# Patient Record
Sex: Male | Born: 1935 | Race: White | Smoking: Former smoker
Health system: Southern US, Community
[De-identification: ages and names within clinical notes are randomized; demographics above are authoritative.]

## PROBLEM LIST (undated history)

## (undated) DIAGNOSIS — I509 Heart failure, unspecified: Secondary | ICD-10-CM

## (undated) DIAGNOSIS — I4891 Unspecified atrial fibrillation: Secondary | ICD-10-CM

## (undated) DIAGNOSIS — H9110 Presbycusis, unspecified ear: Secondary | ICD-10-CM

## (undated) DIAGNOSIS — F99 Mental disorder, not otherwise specified: Secondary | ICD-10-CM

## (undated) DIAGNOSIS — Z8611 Personal history of tuberculosis: Secondary | ICD-10-CM

## (undated) DIAGNOSIS — J449 Chronic obstructive pulmonary disease, unspecified: Secondary | ICD-10-CM

## (undated) HISTORY — DX: Chronic obstructive pulmonary disease, unspecified: J44.9

## (undated) HISTORY — DX: Heart failure, unspecified: I50.9

## (undated) HISTORY — DX: Unspecified atrial fibrillation: I48.91

## (undated) HISTORY — DX: Presbycusis, unspecified ear: H91.10

## (undated) HISTORY — DX: Personal history of tuberculosis: Z86.11

## (undated) HISTORY — DX: Mental disorder, not otherwise specified: F99

---

## 2012-06-06 HISTORY — PX: TRANSURETHRAL RESECTION OF PROSTATE: SHX73

## 2012-09-06 DIAGNOSIS — F99 Mental disorder, not otherwise specified: Secondary | ICD-10-CM

## 2012-09-06 HISTORY — PX: CT PERC CHOLECYSTOSTOMY: HXRAD817

## 2012-09-06 HISTORY — DX: Mental disorder, not otherwise specified: F99

## 2013-04-21 DIAGNOSIS — I509 Heart failure, unspecified: Secondary | ICD-10-CM | POA: Insufficient documentation

## 2013-04-21 DIAGNOSIS — R609 Edema, unspecified: Secondary | ICD-10-CM

## 2013-04-21 DIAGNOSIS — J4489 Other specified chronic obstructive pulmonary disease: Secondary | ICD-10-CM | POA: Insufficient documentation

## 2013-04-21 DIAGNOSIS — I4891 Unspecified atrial fibrillation: Secondary | ICD-10-CM

## 2013-04-21 DIAGNOSIS — Z79899 Other long term (current) drug therapy: Secondary | ICD-10-CM

## 2013-04-21 DIAGNOSIS — R413 Other amnesia: Secondary | ICD-10-CM

## 2013-04-21 DIAGNOSIS — J449 Chronic obstructive pulmonary disease, unspecified: Secondary | ICD-10-CM

## 2013-05-06 ENCOUNTER — Ambulatory Visit (INDEPENDENT_AMBULATORY_CARE_PROVIDER_SITE_OTHER): Payer: Medicare HMO | Admitting: Cardiology

## 2013-05-06 ENCOUNTER — Encounter: Payer: Self-pay | Admitting: Cardiology

## 2013-05-06 VITALS — BP 138/60 | HR 60 | Ht 71.0 in | Wt 194.0 lb

## 2013-05-06 DIAGNOSIS — I4892 Unspecified atrial flutter: Secondary | ICD-10-CM

## 2013-05-06 NOTE — Progress Notes (Signed)
Patient ID: John Cherry, male   DOB: 1918-01-02, 79 y.o.   MRN: [id number]      [address] [place], [place]  [address] Phone: [phone] Fax:  [phone]  Date:  05/06/2013   ID:  Coty Larsh, DOB 1918-06-01, MRN [id number]  PCP:  Ginette Otto, MD   History of Present Illness: Natan Hartog is a 79 y.o. male with chronic atrial fibrillation/flutter on chronic anticoagulation with history of diastolic heart failure while in [place] here for evaluation at the request of Dr. Pete Glatter. Nubulizer helps with mucus, phlegm. No chest pain. No significant dyspnea on exertion.  He's been having some lower extremity edema which appears to be chronic and a degree of venous insufficiency as well. Thankfully, BNP was 98. Hemoglobin 13.3. INR 2.6 managed by Dr. Pete Glatter. Creatinine is 1.3 to with potassium of 4.1. He has been on twice a day Lasix 40 mg.  11-06-12wife died. At the end of Jan 10, 2012 went into hospital. Diastolic HF episode, ?PNA. TURP 11/13. Rehospitalization because of cholecystitis. Daughter is Museum/gallery exhibitions officer. Hypotension with anesthesia. Never took gall bladder out because of hypotension. Wanted to move here. 04/09/13. [facility].   [place]. Enjoys PT.      Wt Readings from Last 3 Encounters:  05/06/13 87.998 kg (194 lb)     Past Medical History  Diagnosis Date  . CHF (congestive heart failure)   . COPD (chronic obstructive pulmonary disease)   . Atrial fibrillation   . Presbycusis   . H/O TB (tuberculosis)     as a teenager  . Abnormal mini-mental status exam February 2014    24/30    Past Surgical History  Procedure Laterality Date  . Transurethral resection of prostate  November 2013  . Ct perc cholecystostomy  February 2014    Current Outpatient Prescriptions  Medication Sig Dispense Refill  . acetaminophen (TYLENOL) 500 MG tablet Take 500 mg by mouth every 6 (six)  hours as needed for pain.      . bisacodyl (BISACODYL) 5 MG EC tablet Take 10 mg by mouth daily as needed for constipation.      . digoxin (LANOXIN) 0.125 MG tablet Take 0.125 mg by mouth daily.      Marland Kitchen diltiazem (DILACOR XR) 120 MG 24 hr capsule Take 120 mg by mouth daily.      . furosemide (LASIX) 40 MG tablet Take 40 mg by mouth 2 (two) times daily.       Marland Kitchen gabapentin (NEURONTIN) 800 MG tablet Take 800 mg by mouth 2 (two) times daily.      Marland Kitchen ipratropium-albuterol (DUONEB) 0.5-2.5 (3) MG/3ML SOLN Take 3 mLs by nebulization every 6 (six) hours as needed.      . Loperamide-Simethicone (IMODIUM ADVANCED) 2-125 MG CHEW Chew by mouth.      . Multiple Vitamin (MULTIVITAMIN) tablet Take 1 tablet by mouth daily.      . NON FORMULARY 2 liters of O2 daily      . pantoprazole (PROTONIX) 40 MG tablet Take 40 mg by mouth daily.      . potassium chloride (K-DUR,KLOR-CON) 10 MEQ tablet Take 10 mEq by mouth 3 (three) times daily.      . Probiotic Product (PROBIOTIC DAILY PO) Take by mouth.      . tamsulosin (FLOMAX) 0.4 MG CAPS capsule Take by mouth daily after supper.      . warfarin (COUMADIN)  5 MG tablet Take 5 mg by mouth daily. 5mg  daily except on Mon,Wed,Fri,Sat take 7.5 mg       No current facility-administered medications for this visit.    Allergies:    Allergies  Allergen Reactions  . Sulfa Antibiotics     Social History:  The patient  reports that he quit smoking about 49 years ago. He does not have any smokeless tobacco history on file. He reports that  drinks alcohol.   Family history is noncontributory.  ROS:  Please see the history of present illness.   Denies any fevers, chills, orthopnea, PND, syncope, rashes. He does have some short-term memory loss but overall judgment is quite good. COPD has been present. Chronic bronchitis.    All other systems reviewed and negative.   PHYSICAL EXAM: VS:  BP 138/60  Pulse 60  Ht 5\' 11"  (1.803 m)  Wt 87.998 kg (194 lb)  BMI 27.07 kg/m2  SpO2  97% Well nourished, well developed, in no acute distress, elderly male, uses walker HEENT: normalNCAT, EOMI Neck: no JVDNo obvious carotid bruits, normal upstroke Cardiac:  normal S1, S2; RRR; soft systolic murmur right upper sternal border Lungs: + wheezing bilaterally, rhonchi or ralesNormal respiratory effort Abd: soft, nontender, no hepatomegaly Ext: Chronic appearing lower extremity 2+ edema, compression stockings knee high Skin: warm and dry GU: Deferred Neuro: no focal abnormalities noted  EKG:   Atrial flutter heart rate of 60, 4-1 conduction, left axis deviation, poor R wave progression.   ASSESSMENT AND PLAN:  79 year old with chronic atrial fibrillation/flutter, chronic anticoagulation, chronic venous insufficiency/lower extremity edema, chronic kidney disease stage III.  1. Atrial flutter/fibrillation-continue with current therapy including digoxin/low-dose diltiazem. He is very well rate controlled. No hilar symptoms such as syncope.  2. Chronic anticoagulation-managed with Dr. Pete Glatter. 3. Chronic diastolic heart failure-actually, BNP is 92, normal. I will continue with Lasix 40 mg twice a day. I explained to them that overdiuresis may result in more difficulties such as dizziness, hypotension, worsening renal function. I heard wheezes today on exam, likely from chronic lung disease. 4. COPD-end expiratory wheezes heard throughout lung fields. He is actually doing quite well, ambulating well. Continue with nebulizer. Dr. Shelle Iron will be seeing him. 5. Chronic venous insufficiency/edema-we will give him a prescription for compression hose.  Prior medical records reviewed, we will see him back in 6 months.  Signed, Donato Schultz, MD [facility]  05/06/2013 3:58 PM

## 2013-05-06 NOTE — Patient Instructions (Addendum)
Your physician wants you to follow-up in: 6 months with Dr. Anne Fu. You will receive a reminder letter in the mail two months in advance. If you don't receive a letter, please call our office to schedule the follow-up appointment.  Pt given written rx for knee high compression stockings. (20-56mmhg closed toe)  Your physician recommends that you continue on your current medications as directed. Please refer to the Current Medication list given to you today.

## 2013-05-20 ENCOUNTER — Ambulatory Visit (INDEPENDENT_AMBULATORY_CARE_PROVIDER_SITE_OTHER): Payer: Medicare HMO | Admitting: Pulmonary Disease

## 2013-05-20 ENCOUNTER — Encounter: Payer: Self-pay | Admitting: Pulmonary Disease

## 2013-05-20 VITALS — BP 104/52 | HR 62 | Temp 98.2°F | Ht 70.0 in | Wt 192.0 lb

## 2013-05-20 DIAGNOSIS — J449 Chronic obstructive pulmonary disease, unspecified: Secondary | ICD-10-CM

## 2013-05-20 NOTE — Patient Instructions (Signed)
You do not have significant copd by your breathing studies.  Therefore would not put you on everyday medication at this time. Use breathing treatments as needed.  However, I want you to sit down and give yourself about 5 min to rest if you get short of breath before reaching for your nebulizer treatment.  If your breathing does not settle down, ok to proceed with the breathing treatment. Try zyrtec 10mg  one each night at bedtime for a few weeks to see if it helps with your drainage and the noise coming from your upper airway. Work on a conditioning program followup with me as needed.

## 2013-05-20 NOTE — Progress Notes (Signed)
  Subjective:    Patient ID: John Cherry, male    DOB: [birth year], 79 y.o.   MRN: [id number]  HPI The patient is a 79 year old male who been asked to see for COPD.  The patient recently moved here from [place], and is on as needed nebulized bronchodilators for his breathing.  He has a history of smoking, but quit many years ago.  He tells me that he has never had pulmonary function studies before, but has been given the diagnosis of "COPD".  He also has a history of chronic atrial fibrillation for which he is on anticoagulation, as well as diastolic dysfunction with lower extremity edema.  The patient tells me that he has fairly good exertional tolerance, and does quite a bit of walking on a regular basis.  He is not able to go up and down stairs more because of physical disability.  He does get short of breath at times, and will take one of his nebulizer treatments, and thinks that it does help.  He will typically use the nebulizer one to 4 times a day.  He states that he usually does not sit down to get his breath before reaching for a nebulizer treatment.  He has also been noted to have audible wheezing, but his daughter states that he will often cough and clear his throat in the wheezing will go away.  This is classic for upper airway pseudo-wheezing.  He has minimal cough, but at times will bring a non-purulent mucus intermittently.  His daughter thinks a lot of this is coming from his nasal passages.  The patient has a history of tuberculosis as a child, and has been told that his x-ray is abnormal.  The daughter tells me that an x-ray just prior to leaving [place] showed no acute process.   Review of Systems  Constitutional: Negative for fever and unexpected weight change.  HENT: Negative for congestion, dental problem, ear pain, nosebleeds, postnasal drip, rhinorrhea, sinus pressure, sneezing, sore throat and trouble swallowing.   Eyes: Negative for redness and itching.  Respiratory: Positive  for wheezing. Negative for cough, chest tightness and shortness of breath.   Cardiovascular: Positive for leg swelling. Negative for palpitations.  Gastrointestinal: Negative for nausea and vomiting.  Genitourinary: Negative for dysuria.  Musculoskeletal: Negative for joint swelling.  Skin: Negative for rash.  Neurological: Negative for headaches.  Hematological: Does not bruise/bleed easily.  Psychiatric/Behavioral: Negative for dysphoric mood. The patient is not nervous/anxious.        Objective:   Physical Exam Constitutional:  Well developed, no acute distress  HENT:  Nares patent without discharge, septal deviation to the left with narrowing.  Oropharynx without exudate, palate and uvula are normal  Eyes:  Perrla, eomi, no scleral icterus  Neck:  No JVD, no TMG  Cardiovascular:  Normal rate, regular rhythm, no rubs or gallops.  2/6 sem        Intact distal pulses but decreased.  Pulmonary :  Normal breath sounds, no stridor or respiratory distress   No rhonchi, or wheezing.  Mild basilar crackles, prominent upper airway pseudowheezing.   Abdominal:  Soft, nondistended, bowel sounds present.  No tenderness noted.   Musculoskeletal:  1+ lower extremity edema noted.  Lymph Nodes:  No cervical lymphadenopathy noted  Skin:  No cyanosis noted  Neurologic:  Alert, appropriate, moves all 4 extremities without obvious deficit.         Assessment & Plan:

## 2013-05-20 NOTE — Assessment & Plan Note (Signed)
The patient has very little airflow obstruction by his spirometry today, and therefore I do not think we should put him on a maintenance medication regimen at this time.  I think it is okay for him to use his nebulized bronchodilators as needed, but I have asked him not to use these for "wheezing".  His wheezing is clearly coming from his upper airway, and probably related to postnasal drip, or upper airway instability.  He does have symptoms of postnasal drip and mucus at times.  I have also asked him to sit down first and see if his breathing issues resolve if he gets short of breath before reaching for his nebulizer machine.  I really think strengthening and conditioning will help him from a quality of life standpoint more than anything else.  I will see him back on a p.r.n. Basis, but would be happy to see him again if his symptoms worsen.

## 2013-06-15 ENCOUNTER — Encounter: Payer: Self-pay | Admitting: Pulmonary Disease

## 2013-06-15 DIAGNOSIS — J90 Pleural effusion, not elsewhere classified: Secondary | ICD-10-CM | POA: Insufficient documentation

## 2013-09-14 ENCOUNTER — Encounter (INDEPENDENT_AMBULATORY_CARE_PROVIDER_SITE_OTHER): Payer: Self-pay

## 2013-09-14 ENCOUNTER — Ambulatory Visit (INDEPENDENT_AMBULATORY_CARE_PROVIDER_SITE_OTHER): Payer: Medicare HMO | Admitting: Cardiology

## 2013-09-14 ENCOUNTER — Encounter: Payer: Self-pay | Admitting: Cardiology

## 2013-09-14 VITALS — BP 118/64 | HR 43 | Ht 70.0 in | Wt 191.0 lb

## 2013-09-14 DIAGNOSIS — J4489 Other specified chronic obstructive pulmonary disease: Secondary | ICD-10-CM

## 2013-09-14 DIAGNOSIS — I4891 Unspecified atrial fibrillation: Secondary | ICD-10-CM

## 2013-09-14 DIAGNOSIS — Z79899 Other long term (current) drug therapy: Secondary | ICD-10-CM

## 2013-09-14 DIAGNOSIS — I4892 Unspecified atrial flutter: Secondary | ICD-10-CM

## 2013-09-14 DIAGNOSIS — J449 Chronic obstructive pulmonary disease, unspecified: Secondary | ICD-10-CM

## 2013-09-14 NOTE — Patient Instructions (Signed)
Your physician has recommended you make the following change in your medication:   1. Decrease Lasix to 40 mg once daily.  Your physician wants you to follow-up in: 6 months with Dr. Dawna Part will receive a reminder letter in the mail two months in advance. If you don't receive a letter, please call our office to schedule the follow-up appointment.

## 2013-09-14 NOTE — Progress Notes (Signed)
Patient ID: John Cherry, male   DOB: 1917/11/09, 80 y.o.   MRN: [id number]      3  [address], [address], [place]  [address] Phone: [phone] Fax:  [phone]  Date:  09/14/2013   ID:  Tremell Reimers, DOB 03-29-1918, MRN [id number]  PCP:  Mathews Argyle, MD   History of Present Illness: Kentarius Partington is a 80 y.o. male with chronic atrial fibrillation/flutter on chronic anticoagulation with history of diastolic heart failure while in [place] here for evaluation at the request of Dr. Felipa Eth. Nubulizer helps with mucus, phlegm. No chest pain. No significant dyspnea on exertion.  He's been having some lower extremity edema which appears to be chronic and a degree of venous insufficiency as well. Thankfully, BNP was 98. Hemoglobin 13.3. INR 2.6 managed by Dr. Felipa Eth. Creatinine is 1.3 to with potassium of 4.1. He has been on twice a day Lasix 40 mg.  06-06-11 wife died. At the end of 12-05-2011 went into hospital. Diastolic HF episode, ?PNA. TURP 11/13. Rehospitalization because of cholecystitis. Daughter is Firefighter. Hypotension with anesthesia. Never took gall bladder out because of hypotension. Wanted to move here. 04/09/13. [facility].   [place]. Enjoys PT.   Cut back on medications. Dr. Gwenette Greet. Very little COPD. Off nebs. Off neurontin. Off digoxin. Currently doing quite well. Interested in going to [place] on a cruise.     Wt Readings from Last 3 Encounters:  09/14/13 191 lb (86.637 kg)  05/20/13 192 lb (87.091 kg)  05/06/13 194 lb (87.998 kg)     Past Medical History  Diagnosis Date  . CHF (congestive heart failure)   . COPD (chronic obstructive pulmonary disease)   . Atrial fibrillation   . Presbycusis   . H/O TB (tuberculosis)     as a teenager  . Abnormal mini-mental status exam February 2014    24/30    Past Surgical History  Procedure Laterality Date  .  Transurethral resection of prostate  November 2013  . Ct perc cholecystostomy  February 2014    Current Outpatient Prescriptions  Medication Sig Dispense Refill  . acetaminophen (TYLENOL) 500 MG tablet Take 500 mg by mouth every 6 (six) hours as needed for pain.      Marland Kitchen diltiazem (DILACOR XR) 120 MG 24 hr capsule Take 120 mg by mouth daily.      . furosemide (LASIX) 40 MG tablet Take 40 mg by mouth 2 (two) times daily.       . potassium chloride (K-DUR,KLOR-CON) 10 MEQ tablet Take 10 mEq by mouth 2 (two) times daily.       Marland Kitchen warfarin (COUMADIN) 5 MG tablet Take 6 mg by mouth daily.        No current facility-administered medications for this visit.    Allergies:    Allergies  Allergen Reactions  . Sulfa Antibiotics     Social History:  The patient  reports that he quit smoking about 49 years ago. His smoking use included Cigarettes. He has a 25 pack-year smoking history. He does not have any smokeless tobacco history on file. He reports that he drinks alcohol. He reports that he does not use illicit drugs.   Family history is noncontributory.  ROS:  Please see the history of present illness.   Denies any fevers, chills, orthopnea, PND, syncope, rashes. He does have some short-term memory loss but overall judgment is quite  good. COPD has been present. Chronic bronchitis.    All other systems reviewed and negative.   PHYSICAL EXAM: VS:  BP 118/64  Pulse 43  Ht 5\' 10"  (1.778 m)  Wt 191 lb (86.637 kg)  BMI 27.41 kg/m2  SpO2 94% Well nourished, well developed, in no acute distress, elderly male, uses walker HEENT: normalNCAT, EOMI Neck: no JVDNo obvious carotid bruits, normal upstroke Cardiac:  Mildly irregular, at times tachycardic.; soft systolic murmur right upper sternal border Lungs: + wheezing bilaterally, rhonchi or ralesNormal respiratory effort Abd: soft, nontender, no hepatomegaly Ext: Chronic appearing lower extremity 2+ edema, compression stockings knee high Skin: warm  and dry GU: Deferred Neuro: no focal abnormalities noted  EKG:   Atrial flutter heart rate of 60, 4-1 conduction, left axis deviation, poor R wave progression.   ASSESSMENT AND PLAN:  80 year old with chronic atrial fibrillation/flutter, chronic anticoagulation, chronic venous insufficiency/lower extremity edema, chronic kidney disease stage III.  1. Atrial flutter/fibrillation-was on low dose digoxin which was stopped by Dr. Felipa Eth after review with daughter. Low-dose diltiazem. He is very well rate controlled. No hilar symptoms such as syncope. Bradycardia first noted however when I auscultated him, at times he was tachycardic. 2. Chronic anticoagulation-managed with Dr. Felipa Eth. Explained in aspirin alone would not be sufficient in stroke prevention. No bleeding. 3. Chronic diastolic heart failure-actually, BNP is 92, normal. I will continue with Lasix 40 mg only once a day. Continue with daily weights.  I explained to them that overdiuresis may result in more difficulties such as dizziness, hypotension, worsening renal function. I heard wheezes today on exam, likely from chronic lung disease. 4. COPD-prior end expiratory wheezes heard throughout lung fields. He is actually doing quite well, ambulating well. Continue with nebulizer. Dr. Gwenette Greet has stopped nebs. 5. Chronic venous insufficiency/edema-we will give him a prescription for compression hose.  Prior medical records reviewed, we will see him back in 6 months.  Signed, Candee Furbish, MD [facility]  09/14/2013 10:03 AM

## 2013-09-16 ENCOUNTER — Encounter: Payer: Self-pay | Admitting: Cardiology

## 2013-10-14 ENCOUNTER — Other Ambulatory Visit: Payer: Self-pay | Admitting: Geriatric Medicine

## 2013-10-14 ENCOUNTER — Ambulatory Visit
Admission: RE | Admit: 2013-10-14 | Discharge: 2013-10-14 | Disposition: A | Discharge status: Home / Self Care | Payer: Medicare HMO | Source: Ambulatory Visit | Attending: Geriatric Medicine | Admitting: Geriatric Medicine

## 2013-10-14 DIAGNOSIS — R062 Wheezing: Secondary | ICD-10-CM

## 2013-12-16 ENCOUNTER — Ambulatory Visit (INDEPENDENT_AMBULATORY_CARE_PROVIDER_SITE_OTHER): Payer: Medicare HMO | Admitting: Cardiology

## 2013-12-16 ENCOUNTER — Encounter: Payer: Self-pay | Admitting: Cardiology

## 2013-12-16 VITALS — BP 116/54 | HR 67 | Ht 70.0 in | Wt 191.4 lb

## 2013-12-16 DIAGNOSIS — Z79899 Other long term (current) drug therapy: Secondary | ICD-10-CM

## 2013-12-16 DIAGNOSIS — I4891 Unspecified atrial fibrillation: Secondary | ICD-10-CM

## 2013-12-16 DIAGNOSIS — J449 Chronic obstructive pulmonary disease, unspecified: Secondary | ICD-10-CM

## 2013-12-16 DIAGNOSIS — Z7901 Long term (current) use of anticoagulants: Secondary | ICD-10-CM

## 2013-12-16 LAB — BASIC METABOLIC PANEL
BUN: 20 mg/dL (ref 6–23)
CO2: 35 mEq/L — ABNORMAL HIGH (ref 19–32)
Calcium: 8.8 mg/dL (ref 8.4–10.5)
Chloride: 100 mEq/L (ref 96–112)
Creatinine, Ser: 1.1 mg/dL (ref 0.4–1.5)
GFR: 68.45 mL/min (ref >60.00)
Glucose, Bld: 83 mg/dL (ref 70–99)
POTASSIUM: 4.3 meq/L (ref 3.5–5.1)
SODIUM: 139 meq/L (ref 135–145)

## 2013-12-16 LAB — CBC WITH DIFFERENTIAL/PLATELET
BASOS ABS: 0.1 10*3/uL (ref 0.0–0.1)
Basophils Relative: 0.9 % (ref 0.0–3.0)
EOS ABS: 0.3 10*3/uL (ref 0.0–0.7)
Eosinophils Relative: 3.7 % (ref 0.0–5.0)
HEMATOCRIT: 42.7 % (ref 39.0–52.0)
HEMOGLOBIN: 14 g/dL (ref 13.0–17.0)
LYMPHS PCT: 28.7 % (ref 12.0–46.0)
Lymphs Abs: 2.3 10*3/uL (ref 0.7–4.0)
MCHC: 32.7 g/dL (ref 30.0–36.0)
MCV: 95.1 fl (ref 78.0–100.0)
MONO ABS: 0.8 10*3/uL (ref 0.1–1.0)
Monocytes Relative: 10 % (ref 3.0–12.0)
Neutro Abs: 4.5 10*3/uL (ref 1.4–7.7)
Neutrophils Relative %: 56.7 % (ref 43.0–77.0)
Platelets: 268 10*3/uL (ref 150.0–400.0)
RBC: 4.49 Mil/uL (ref 4.22–5.81)
RDW: 14.7 % (ref 11.5–15.5)
WBC: 7.9 10*3/uL (ref 4.0–10.5)

## 2013-12-16 NOTE — Progress Notes (Signed)
Patient ID: John Cherry, male   DOB: [birth year], 80 y.o.   MRN: [id number]        [address], [place]  [address] Phone: [phone] Fax:  [phone]  Date:  12/16/2013   ID:  John Cherry, DOB [birth year], MRN [id number]  PCP:  Mathews Argyle, MD   History of Present Illness: Rumeal Cullipher is a 80 y.o. male with chronic atrial fibrillation/flutter on chronic anticoagulation with history of diastolic heart failure while in [place] here for evaluation at the request of Dr. Felipa Eth. Nubulizer helps with mucus, phlegm. No chest pain. No significant dyspnea on exertion.  He's been having some lower extremity edema which appears to be chronic and a degree of venous insufficiency as well. Thankfully, BNP was 98. Hemoglobin 13.3. INR 2.6 managed by Dr. Felipa Eth. Creatinine is 1.3 to with potassium of 4.1. He has been on twice a day Lasix 40 mg.  05-10-2011 wife died. At the end of 11/08/11 went into hospital. Diastolic HF episode, ?PNA. TURP 11/13. Rehospitalization because of cholecystitis. Daughter is Firefighter. Hypotension with anesthesia. Never took gall bladder out because of hypotension. Wanted to move here. 04/09/13. [facility].   [place]. Enjoys PT.   Cut back on medications. Dr. Gwenette Greet. Very little COPD. Off nebs. Off neurontin. Off digoxin. Currently doing quite well. Interested in going to [place] on a cruise.  No bleeding on Eliquis.   Wt Readings from Last 3 Encounters:  12/16/13 191 lb 6.4 oz (86.818 kg)  09/14/13 191 lb (86.637 kg)  05/20/13 192 lb (87.091 kg)     Past Medical History  Diagnosis Date  . CHF (congestive heart failure)   . COPD (chronic obstructive pulmonary disease)   . Atrial fibrillation   . Presbycusis   . H/O TB (tuberculosis)     as a teenager  . Abnormal mini-mental status exam February 2014    24/30    Past Surgical History  Procedure  Laterality Date  . Transurethral resection of prostate  November 2013  . Ct perc cholecystostomy  February 2014    Current Outpatient Prescriptions  Medication Sig Dispense Refill  . acetaminophen (TYLENOL) 500 MG tablet Take 500 mg by mouth every 6 (six) hours as needed for pain.      Marland Kitchen apixaban (ELIQUIS) 2.5 MG TABS tablet Take 2.5 mg by mouth 2 (two) times daily.      . cetirizine (ZYRTEC) 10 MG tablet Take 10 mg by mouth as needed for allergies.      Marland Kitchen diltiazem (DILACOR XR) 120 MG 24 hr capsule Take 120 mg by mouth daily.      . furosemide (LASIX) 40 MG tablet Take 40 mg by mouth 2 (two) times daily.       . potassium chloride (K-DUR,KLOR-CON) 10 MEQ tablet Take 10 mEq by mouth 2 (two) times daily.       Marland Kitchen warfarin (COUMADIN) 5 MG tablet Take 6 mg by mouth daily.        No current facility-administered medications for this visit.    Allergies:    Allergies  Allergen Reactions  . Sulfa Antibiotics     Social History:  The patient  reports that he quit smoking about 49 years ago. His smoking use included Cigarettes. He has a 25 pack-year smoking history. He does not have any smokeless tobacco history on file. He reports that he drinks alcohol. He  reports that he does not use illicit drugs.   Family history is noncontributory.  ROS:  Please see the history of present illness.   Denies any fevers, chills, orthopnea, PND, syncope, rashes. He does have some short-term memory loss but overall judgment is quite good. COPD has been present. Chronic bronchitis.    All other systems reviewed and negative.   PHYSICAL EXAM: VS:  BP 116/54  Pulse 67  Ht 5\' 10"  (1.778 m)  Wt 191 lb 6.4 oz (86.818 kg)  BMI 27.46 kg/m2  SpO2 95% Well nourished, well developed, in no acute distress, elderly male, uses walker HEENT: normalNCAT, EOMI Neck: no JVDNo obvious carotid bruits, normal upstroke Cardiac:  Mildly irregular, at times tachycardic.; soft systolic murmur right upper sternal  border Lungs: decrease wheezing bilaterally, rhonchi or ralesNormal respiratory effort Abd: soft, nontender, no hepatomegaly Ext: Chronic appearing lower extremity 2+ edema, compression stockings knee high Skin: warm and dry GU: Deferred Neuro: no focal abnormalities noted  EKG:   Atrial flutter heart rate of 60, 4-1 conduction, left axis deviation, poor R wave progression.   ASSESSMENT AND PLAN:  80 year old with chronic atrial fibrillation/flutter, chronic anticoagulation, chronic venous insufficiency/lower extremity edema, chronic kidney disease stage III.  1. Atrial flutter/fibrillation-was on low dose digoxin which was stopped by Dr. Felipa Eth after review with daughter. Low-dose diltiazem. He is very well rate controlled. No high risk symptoms such as syncope. Doing well. 2. Chronic anticoagulation-doing well on low-dose Eliquis. No bleeding. In fact, had Mohs surgery on left face without difficulty. Did not have to stop Eliquis. 3. Chronic diastolic heart failure-actually, BNP is 92, normal. I will continue with Lasix 40 mg only once a day. Continue with daily weights.  I explained to them that overdiuresis may result in more difficulties such as dizziness, hypotension, worsening renal function. 4. COPD-prior end expiratory wheezes heard throughout lung fields. He is actually doing quite well, ambulating well.  Dr. Gwenette Greet has stopped nebs. Zyrtec helped. 5. Chronic venous insufficiency/edema-we gave him a prescription for compression hose.  Checking basic metabolic profile and CBC Prior medical records reviewed, we will see him back in 6 months.  Signed, Candee Furbish, MD [facility]  12/16/2013 11:04 AM

## 2013-12-16 NOTE — Patient Instructions (Signed)
You will need lab work today:  Bmp, cbc We will call you with your results  Your physician wants you to follow-up in: 6 months with Dr. Marlou Porch & repeat lab work ( cbc, bmp) You will receive a reminder letter in the mail two months in advance. If you don't receive a letter, please call our office to schedule the follow-up appointment.

## 2014-06-18 ENCOUNTER — Encounter: Payer: Self-pay | Admitting: Cardiology

## 2014-06-18 ENCOUNTER — Ambulatory Visit (INDEPENDENT_AMBULATORY_CARE_PROVIDER_SITE_OTHER): Payer: Medicare HMO | Admitting: Cardiology

## 2014-06-18 VITALS — BP 128/68 | HR 63 | Ht 70.0 in | Wt 199.0 lb

## 2014-06-18 DIAGNOSIS — N182 Chronic kidney disease, stage 2 (mild): Secondary | ICD-10-CM

## 2014-06-18 DIAGNOSIS — I4819 Other persistent atrial fibrillation: Secondary | ICD-10-CM

## 2014-06-18 DIAGNOSIS — Z7901 Long term (current) use of anticoagulants: Secondary | ICD-10-CM

## 2014-06-18 DIAGNOSIS — I4892 Unspecified atrial flutter: Secondary | ICD-10-CM

## 2014-06-18 DIAGNOSIS — I481 Persistent atrial fibrillation: Secondary | ICD-10-CM

## 2014-06-18 DIAGNOSIS — I5032 Chronic diastolic (congestive) heart failure: Secondary | ICD-10-CM

## 2014-06-18 NOTE — Progress Notes (Signed)
Patient ID: John Cherry, male   DOB: 1918-03-16, 80 y.o.   MRN: [id number]        [address] [place], [place]  [address] Phone: [phone] Fax:  [phone]  Date:  06/18/2014   ID:  John Cherry, DOB 12-15-1917, MRN [id number]  PCP:  Mathews Argyle, MD   History of Present Illness: Dustyn Dansereau is a 80 y.o. male with chronic atrial fibrillation/flutter on chronic anticoagulation with history of diastolic heart failure while in [place] here for follow-up. Chronic and a degree of venous insufficiency as well. Thankfully, BNP was 98. Hemoglobin 13.3. Creatinine is 1.12- 1.3 to with potassium of 4.1. He has been on twice a day Lasix 40 mg.  05-08-11 wife died. At the end of 11-06-2011 went into hospital. Diastolic HF episode, ?PNA. TURP 11/13. Rehospitalization because of cholecystitis. Daughter is Firefighter. Hypotension with anesthesia. Never took gall bladder out because of hypotension. Wanted to move here. 04/09/13. [facility].   [place] Enjoys PT.   Cut back on medications. Dr. Gwenette Greet. Very little COPD. Off nebs. Off neurontin. Off digoxin. Currently doing quite well. Has a strong will to live.  No bleeding on Eliquis.     Wt Readings from Last 3 Encounters:  06/18/14 199 lb (90.266 kg)  12/16/13 191 lb 6.4 oz (86.818 kg)  09/14/13 191 lb (86.637 kg)     Past Medical History  Diagnosis Date  . CHF (congestive heart failure)   . COPD (chronic obstructive pulmonary disease)   . Atrial fibrillation   . Presbycusis   . H/O TB (tuberculosis)     as a teenager  . Abnormal mini-mental status exam February 2014    24/30    Past Surgical History  Procedure Laterality Date  . Transurethral resection of prostate  November 2013  . Ct perc cholecystostomy  February 2014    Current Outpatient Prescriptions  Medication Sig Dispense Refill  . acetaminophen (TYLENOL) 500 MG  tablet Take 500 mg by mouth every 6 (six) hours as needed for pain.    Marland Kitchen apixaban (ELIQUIS) 2.5 MG TABS tablet Take 2.5 mg by mouth 2 (two) times daily.    Marland Kitchen diltiazem (DILACOR XR) 120 MG 24 hr capsule Take 120 mg by mouth daily.    . furosemide (LASIX) 40 MG tablet Take 40 mg by mouth 2 (two) times daily.     . potassium chloride (K-DUR) 10 MEQ tablet      No current facility-administered medications for this visit.    Allergies:    Allergies  Allergen Reactions  . Sulfa Antibiotics     Social History:  The patient  reports that he quit smoking about 50 years ago. His smoking use included Cigarettes. He has a 25 pack-year smoking history. He does not have any smokeless tobacco history on file. He reports that he drinks alcohol. He reports that he does not use illicit drugs.   Family history is noncontributory.  ROS:  Please see the history of present illness.   Denies any fevers, chills, orthopnea, PND, syncope, rashes. He does have some short-term memory loss but overall judgment is quite good. COPD has been present. Chronic bronchitis.    All other systems reviewed and negative.   PHYSICAL EXAM: VS:  BP 128/68 mmHg  Pulse 63  Ht 5\' 10"  (1.778 m)  Wt 199 lb (90.266 kg)  BMI 28.55 kg/m2 Well nourished, well  developed, in no acute distress, elderly male, uses walker HEENT: normalNCAT, EOMI Neck: no JVDNo obvious carotid bruits, normal upstroke Cardiac:  Mildly irregular, at times tachycardic.; soft systolic murmur right upper sternal border Lungs: mild wheezing bilaterally, rhonchi or ralesNormal respiratory effort Abd: soft, nontender, no hepatomegaly Ext: Chronic appearing lower extremity 2+ edema, compression stockings knee high Skin: warm and dry GU: Deferred Neuro: no focal abnormalities noted  EKG:  Prior- Atrial flutter heart rate of 60, 4-1 conduction, left axis deviation, poor R wave progression.   ASSESSMENT AND PLAN:  80 year old with chronic atrial  fibrillation/flutter, chronic anticoagulation, chronic venous insufficiency/lower extremity edema, chronic kidney disease stage III.  1. Atrial flutter/fibrillation-was on low dose digoxin which was stopped by Dr. Felipa Eth after review with daughter. Low-dose diltiazem. He is very well rate controlled. No high risk symptoms such as syncope. Doing well. 2. Chronic anticoagulation-doing well on low-dose Eliquis. No bleeding. In fact, had Mohs surgery on left face without difficulty. Did not have to stop Eliquis. 3. Chronic diastolic heart failure-actually, BNP is 92, normal. I will continue with Lasix 40 mg only once a day. Continue with daily weights. He is increased in weight. Monitor salt, peanut usage, fluids. I explained to them that overdiuresis may result in more difficulties such as dizziness, hypotension, worsening renal function. 4. COPD-prior end expiratory wheezes heard throughout lung fields. He is actually doing quite well, ambulating well.  Dr. Gwenette Greet has stopped nebs. Zyrtec helped. No MDI's currently. 5. Chronic venous insufficiency/edema-we gave him a prescription for compression hose previously. 6. 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 Explained age as a factor in creatinine clearance. Agree that he does not need to see a nephrologist. Try to avoid NSAID use.   Prior medical records reviewed, we will see him back in 6 months.  Signed, Candee Furbish, MD [facility]  06/18/2014 11:19 AM

## 2014-06-18 NOTE — Patient Instructions (Signed)
The current medical regimen is effective;  continue present plan and medications.  Follow up in 6 months with Dr. Skains.  You will receive a letter in the mail 2 months before you are due.  Please call us when you receive this letter to schedule your follow up appointment.  

## 2014-09-07 DIAGNOSIS — L570 Actinic keratosis: Secondary | ICD-10-CM | POA: Diagnosis not present

## 2014-09-07 DIAGNOSIS — X32XXXD Exposure to sunlight, subsequent encounter: Secondary | ICD-10-CM | POA: Diagnosis not present

## 2014-09-07 DIAGNOSIS — C44321 Squamous cell carcinoma of skin of nose: Secondary | ICD-10-CM | POA: Diagnosis not present

## 2014-10-12 DIAGNOSIS — L570 Actinic keratosis: Secondary | ICD-10-CM | POA: Diagnosis not present

## 2014-10-12 DIAGNOSIS — Z08 Encounter for follow-up examination after completed treatment for malignant neoplasm: Secondary | ICD-10-CM | POA: Diagnosis not present

## 2014-10-12 DIAGNOSIS — X32XXXD Exposure to sunlight, subsequent encounter: Secondary | ICD-10-CM | POA: Diagnosis not present

## 2014-10-12 DIAGNOSIS — Z85828 Personal history of other malignant neoplasm of skin: Secondary | ICD-10-CM | POA: Diagnosis not present

## 2014-11-24 DIAGNOSIS — I481 Persistent atrial fibrillation: Secondary | ICD-10-CM | POA: Diagnosis not present

## 2014-11-24 DIAGNOSIS — I5022 Chronic systolic (congestive) heart failure: Secondary | ICD-10-CM | POA: Diagnosis not present

## 2014-11-24 DIAGNOSIS — Z79899 Other long term (current) drug therapy: Secondary | ICD-10-CM | POA: Diagnosis not present

## 2014-12-17 ENCOUNTER — Encounter: Payer: Self-pay | Admitting: Cardiology

## 2014-12-17 ENCOUNTER — Ambulatory Visit (INDEPENDENT_AMBULATORY_CARE_PROVIDER_SITE_OTHER): Payer: Commercial Managed Care - HMO | Admitting: Cardiology

## 2014-12-17 VITALS — BP 118/60 | HR 83 | Ht 70.0 in | Wt 192.8 lb

## 2014-12-17 DIAGNOSIS — I4891 Unspecified atrial fibrillation: Secondary | ICD-10-CM | POA: Diagnosis not present

## 2014-12-17 DIAGNOSIS — R609 Edema, unspecified: Secondary | ICD-10-CM

## 2014-12-17 DIAGNOSIS — I4892 Unspecified atrial flutter: Secondary | ICD-10-CM | POA: Diagnosis not present

## 2014-12-17 NOTE — Patient Instructions (Signed)
Medication Instructions:  Your physician recommends that you continue on your current medications as directed. Please refer to the Current Medication list given to you today.  Follow-Up: Follow up in 6 months with Dr. Skains.  You will receive a letter in the mail 2 months before you are due.  Please call us when you receive this letter to schedule your follow up appointment.  Thank you for choosing [facility]!!     

## 2014-12-17 NOTE — Progress Notes (Signed)
Patient ID: John Cherry, male   DOB: 07/23/1918, 81 y.o.   MRN: [id number]        [address], [address] [place], [place]  [address] Phone: [phone] Fax:  [phone]  Date:  12/17/2014   ID:  John Cherry, DOB 01-13-1918, MRN [id number]  PCP:  John Argyle, MD   History of Present Illness: John Cherry is a 81 y.o. male with chronic atrial fibrillation/flutter on chronic anticoagulation with history of diastolic heart failure while in [place] here for follow-up. Chronic and a degree of venous insufficiency as well. Thankfully, BNP was 98. Hemoglobin 13.3. Creatinine is 1.12- 1.3 to with potassium of 4.1. He has been on twice a day Lasix 40 mg.  06-23-2011 wife died. At the end of Nov 21, 2011 went into hospital. Diastolic HF episode, ?PNA. TURP 11/13. Rehospitalization because of cholecystitis. Daughter is Firefighter. Hypotension with anesthesia. Never took gall bladder out because of hypotension. Wanted to move here. 04/09/13. [facility].   [place]. Enjoys PT.   Cut back on medications. Dr. Gwenette Cherry. Very little COPD. Off nebs. Off neurontin. Off digoxin. Currently doing quite well. Has a strong will to live.  No bleeding on Eliquis.  Doing well, [place].      Wt Readings from Last 3 Encounters:  12/17/14 192 lb 12.8 oz (87.454 kg)  06/18/14 199 lb (90.266 kg)  12/16/13 191 lb 6.4 oz (86.818 kg)     Past Medical History  Diagnosis Date  . CHF (congestive heart failure)   . COPD (chronic obstructive pulmonary disease)   . Atrial fibrillation   . Presbycusis   . H/O TB (tuberculosis)     as a teenager  . Abnormal mini-mental status exam February 2014    24/30    Past Surgical History  Procedure Laterality Date  . Transurethral resection of prostate  November 2013  . Ct perc cholecystostomy  February 2014    Current Outpatient Prescriptions  Medication Sig Dispense  Refill  . apixaban (ELIQUIS) 2.5 MG TABS tablet Take 2.5 mg by mouth 2 (two) times daily.    Marland Kitchen diltiazem (DILACOR XR) 120 MG 24 hr capsule Take 120 mg by mouth daily.    . furosemide (LASIX) 40 MG tablet Take 40 mg by mouth 2 (two) times daily.     . potassium chloride (K-DUR) 10 MEQ tablet      No current facility-administered medications for this visit.    Allergies:    Allergies  Allergen Reactions  . Flonase [Fluticasone Propionate]   . Sulfa Antibiotics     Social History:  The patient  reports that he quit smoking about 50 years ago. His smoking use included Cigarettes. He has a 25 pack-year smoking history. He does not have any smokeless tobacco history on file. He reports that he drinks alcohol. He reports that he does not use illicit drugs.   Family history is noncontributory.  ROS:  Please see the history of present illness.   Denies any fevers, chills, orthopnea, PND, syncope, rashes. He does have some short-term memory loss but overall judgment is quite good. COPD has been present. Chronic bronchitis.    All other systems reviewed and negative.   PHYSICAL EXAM: VS:  BP 118/60 mmHg  Pulse 83  Ht 5\' 10"  (1.778 m)  Wt 192 lb 12.8 oz (87.454 kg)  BMI 27.66 kg/m2 Well nourished, well developed, in no  acute distress, elderly male, uses walker HEENT: normalNCAT, EOMI Neck: no JVDNo obvious carotid bruits, normal upstroke Cardiac:  Mildly irregular, at times tachycardic.; soft systolic murmur right upper sternal border Lungs: mild wheezing bilaterally, rhonchi or ralesNormal respiratory effort Abd: soft, nontender, no hepatomegaly Ext: no edema, compression stockings knee high Skin: warm and dry GU: Deferred Neuro: no focal abnormalities noted  EKG:  Prior- Atrial flutter heart rate of 60, 4-1 conduction, left axis deviation, poor R wave progression.   ASSESSMENT AND PLAN:  81 year old with chronic atrial fibrillation/flutter, chronic anticoagulation, chronic venous  insufficiency/lower extremity edema, chronic kidney disease stage III.  1. Atrial flutter/fibrillation-was on low dose digoxin which was stopped by Dr. Felipa Eth after review with daughter. Low-dose diltiazem. He is very well rate controlled. No high risk symptoms such as syncope. Doing well. 2. Chronic anticoagulation-doing well on low-dose Eliquis. No bleeding. In fact, had Mohs surgery on left face without difficulty. Did not have to stop Eliquis. 3. Chronic diastolic heart failure-actually, BNP was 92, normal. I will continue with Lasix 40 mg BID. Continue with daily weights. He is increased in weight. Monitor salt, peanut usage, fluids. I explained to them that overdiuresis may result in more difficulties such as dizziness, hypotension, worsening renal function. 4. COPD-prior end expiratory wheezes heard throughout lung fields. He is actually doing quite well, ambulating well.  Dr. Gwenette Cherry has stopped nebs. Zyrtec helped. No MDI's currently. ?Flonase helped. 5. Chronic venous insufficiency/edema-we gave him a prescription for compression hose previously. Has not needed to use these.  6. 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   Prior medical records reviewed, we will see him back in 6 months.  Signed, John Furbish, MD [facility]  12/17/2014 3:28 PM

## 2015-01-12 DIAGNOSIS — L821 Other seborrheic keratosis: Secondary | ICD-10-CM | POA: Diagnosis not present

## 2015-01-12 DIAGNOSIS — I872 Venous insufficiency (chronic) (peripheral): Secondary | ICD-10-CM | POA: Diagnosis not present

## 2015-01-12 DIAGNOSIS — X32XXXD Exposure to sunlight, subsequent encounter: Secondary | ICD-10-CM | POA: Diagnosis not present

## 2015-01-12 DIAGNOSIS — L570 Actinic keratosis: Secondary | ICD-10-CM | POA: Diagnosis not present

## 2015-01-12 DIAGNOSIS — D0439 Carcinoma in situ of skin of other parts of face: Secondary | ICD-10-CM | POA: Diagnosis not present

## 2015-02-01 DIAGNOSIS — Z08 Encounter for follow-up examination after completed treatment for malignant neoplasm: Secondary | ICD-10-CM | POA: Diagnosis not present

## 2015-02-01 DIAGNOSIS — Z85828 Personal history of other malignant neoplasm of skin: Secondary | ICD-10-CM | POA: Diagnosis not present

## 2015-02-09 DIAGNOSIS — Z0100 Encounter for examination of eyes and vision without abnormal findings: Secondary | ICD-10-CM | POA: Diagnosis not present

## 2015-02-09 DIAGNOSIS — H3531 Nonexudative age-related macular degeneration: Secondary | ICD-10-CM | POA: Diagnosis not present

## 2015-05-25 DIAGNOSIS — I5022 Chronic systolic (congestive) heart failure: Secondary | ICD-10-CM | POA: Diagnosis not present

## 2015-05-25 DIAGNOSIS — I481 Persistent atrial fibrillation: Secondary | ICD-10-CM | POA: Diagnosis not present

## 2015-05-25 DIAGNOSIS — Z79899 Other long term (current) drug therapy: Secondary | ICD-10-CM | POA: Diagnosis not present

## 2015-05-25 DIAGNOSIS — Z23 Encounter for immunization: Secondary | ICD-10-CM | POA: Diagnosis not present

## 2015-06-15 ENCOUNTER — Ambulatory Visit (INDEPENDENT_AMBULATORY_CARE_PROVIDER_SITE_OTHER): Payer: Commercial Managed Care - HMO | Admitting: Cardiology

## 2015-06-15 ENCOUNTER — Encounter: Payer: Self-pay | Admitting: Cardiology

## 2015-06-15 VITALS — BP 128/62 | HR 84 | Ht 71.0 in | Wt 198.1 lb

## 2015-06-15 DIAGNOSIS — I4892 Unspecified atrial flutter: Secondary | ICD-10-CM

## 2015-06-15 DIAGNOSIS — Z7901 Long term (current) use of anticoagulants: Secondary | ICD-10-CM | POA: Diagnosis not present

## 2015-06-15 DIAGNOSIS — J438 Other emphysema: Secondary | ICD-10-CM

## 2015-06-15 NOTE — Patient Instructions (Signed)

## 2015-06-15 NOTE — Progress Notes (Signed)
Patient ID: John Cherry, male   DOB: [birth year], 81 y.o.   MRN: [id number]        [address] [place], [place]  [address] Phone: [phone] Fax:  [phone]  Date:  06/15/2015   ID:  John Cherry, DOB 1917-09-28, MRN [id number]  PCP:  Mathews Argyle, MD   History of Present Illness: John Cherry is a 81 y.o. male with chronic atrial fibrillation/flutter on chronic anticoagulation with history of diastolic heart failure while in [place] here for follow-up. Chronic and a degree of venous insufficiency as well. Thankfully, BNP was 98. Hemoglobin 13.3. Creatinine is 1.12- 1.3 to with potassium of 4.1. He has been on twice a day Lasix 40 mg.  05-20-11 wife died. At the end of November 18, 2011 went into hospital. Diastolic HF episode, ?PNA. TURP 11/13. Rehospitalization because of cholecystitis. Daughter is Firefighter. Hypotension with anesthesia. Never took gall bladder out because of hypotension. Wanted to move here. 04/09/13. [facility].   [place]. Enjoys PT.   Cut back on medications. Dr. Gwenette Greet. Very little COPD. Off nebs. Off neurontin. Off digoxin. Currently doing quite well. Has a strong will to live.  No bleeding on Eliquis.  Doing well, enjoyed [place].      Wt Readings from Last 3 Encounters:  06/15/15 198 lb 1.9 oz (89.867 kg)  12/17/14 192 lb 12.8 oz (87.454 kg)  06/18/14 199 lb (90.266 kg)     Past Medical History  Diagnosis Date  . CHF (congestive heart failure) ([place])   . COPD (chronic obstructive pulmonary disease) ([place])   . Atrial fibrillation ([place])   . Presbycusis   . H/O TB (tuberculosis)     as a teenager  . Abnormal mini-mental status exam February 2014    24/30    Past Surgical History  Procedure Laterality Date  . Transurethral resection of prostate  November 2013  . Ct perc cholecystostomy  February 2014    Current Outpatient Prescriptions    Medication Sig Dispense Refill  . apixaban (ELIQUIS) 2.5 MG TABS tablet Take 2.5 mg by mouth 2 (two) times daily.    Marland Kitchen diltiazem (DILACOR XR) 120 MG 24 hr capsule Take 120 mg by mouth daily.    . furosemide (LASIX) 40 MG tablet Take 40 mg by mouth 2 (two) times daily.     . potassium chloride (K-DUR) 10 MEQ tablet      No current facility-administered medications for this visit.    Allergies:    Allergies  Allergen Reactions  . Flonase [Fluticasone Propionate]   . Sulfa Antibiotics     Social History:  The patient  reports that he quit smoking about 51 years ago. His smoking use included Cigarettes. He has a 25 pack-year smoking history. He does not have any smokeless tobacco history on file. He reports that he drinks alcohol. He reports that he does not use illicit drugs.   Family history is noncontributory.  ROS:  Please see the history of present illness.   Denies any fevers, chills, orthopnea, PND, syncope, rashes. He does have some short-term memory loss but overall judgment is quite good. COPD has been present. Chronic bronchitis.    All other systems reviewed and negative.   PHYSICAL EXAM: VS:  BP 128/62 mmHg  Pulse 84  Ht 5\' 11"  (1.803 m)  Wt 198 lb 1.9 oz (89.867 kg)  BMI 27.64 kg/m2  SpO2  93% Well nourished, well developed, in no acute distress, elderly male, uses walker HEENT: normalNCAT, EOMI Neck: no JVDNo obvious carotid bruits, normal upstroke Cardiac:  Mildly irregular, at times tachycardic.; soft systolic murmur right upper sternal border Lungs: mild wheezing bilaterally, rhonchi or ralesNormal respiratory effort Abd: soft, nontender, no hepatomegaly Ext: Ankle/pedal 1+ edema Skin: warm and dry GU: Deferred Neuro: no focal abnormalities noted  EKG:  Prior- Atrial flutter heart rate of 60, 4-1 conduction, left axis deviation, poor R wave progression.   ASSESSMENT AND PLAN:  81 year old with chronic atrial fibrillation/flutter, chronic anticoagulation,  chronic venous insufficiency/lower extremity edema, chronic kidney disease stage III.  1. Atrial flutter/fibrillation- Low-dose diltiazem. He is very well rate controlled. No high risk symptoms such as syncope. Doing well. 4:1 conduction. 2. Chronic anticoagulation-doing well on low-dose Eliquis. No bleeding. In fact, had Mohs surgery on left face without difficulty. Did not have to stop Eliquis. No serious falls. 3. Chronic diastolic heart failure-actually, BNP was 92, normal. I will continue with Lasix 40 mg BID. Continue with daily weights. He is increased in weight. Monitor salt,  fluids. I explained to them that overdiuresis may result in more difficulties such as dizziness, hypotension, worsening renal function. He tried to use compression hose but this was too challenging. 4. COPD-prior end expiratory wheezes heard throughout lung fields. He is actually doing quite well, ambulating well.  Pulmonary has stopped nebs. Zyrtec helped. No MDI's currently. ?Flonase helped. 5. Chronic venous insufficiency/edema-we gave him a prescription for compression hose previously. Has not needed to use these. Challenging to put on. 6. 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   Prior medical records reviewed, we will see him back in 6 months.  Signed, Candee Furbish, MD [facility]  06/15/2015 11:09 AM

## 2015-06-17 DIAGNOSIS — D2272 Melanocytic nevi of left lower limb, including hip: Secondary | ICD-10-CM | POA: Diagnosis not present

## 2015-06-17 DIAGNOSIS — Z85828 Personal history of other malignant neoplasm of skin: Secondary | ICD-10-CM | POA: Diagnosis not present

## 2015-06-17 DIAGNOSIS — T300 Burn of unspecified body region, unspecified degree: Secondary | ICD-10-CM | POA: Diagnosis not present

## 2015-06-17 DIAGNOSIS — Z08 Encounter for follow-up examination after completed treatment for malignant neoplasm: Secondary | ICD-10-CM | POA: Diagnosis not present

## 2015-06-17 DIAGNOSIS — Z1283 Encounter for screening for malignant neoplasm of skin: Secondary | ICD-10-CM | POA: Diagnosis not present

## 2015-06-17 DIAGNOSIS — D225 Melanocytic nevi of trunk: Secondary | ICD-10-CM | POA: Diagnosis not present

## 2015-06-17 DIAGNOSIS — D485 Neoplasm of uncertain behavior of skin: Secondary | ICD-10-CM | POA: Diagnosis not present

## 2015-10-27 DIAGNOSIS — H6123 Impacted cerumen, bilateral: Secondary | ICD-10-CM | POA: Diagnosis not present

## 2015-11-11 ENCOUNTER — Ambulatory Visit
Admission: RE | Admit: 2015-11-11 | Discharge: 2015-11-11 | Disposition: A | Discharge status: Home / Self Care | Payer: Commercial Managed Care - HMO | Source: Ambulatory Visit | Attending: Geriatric Medicine | Admitting: Geriatric Medicine

## 2015-11-11 ENCOUNTER — Other Ambulatory Visit: Payer: Self-pay | Admitting: Geriatric Medicine

## 2015-11-11 DIAGNOSIS — R0789 Other chest pain: Secondary | ICD-10-CM

## 2015-11-30 DIAGNOSIS — I481 Persistent atrial fibrillation: Secondary | ICD-10-CM | POA: Diagnosis not present

## 2015-11-30 DIAGNOSIS — I5022 Chronic systolic (congestive) heart failure: Secondary | ICD-10-CM | POA: Diagnosis not present

## 2015-11-30 DIAGNOSIS — Z79899 Other long term (current) drug therapy: Secondary | ICD-10-CM | POA: Diagnosis not present

## 2015-12-21 ENCOUNTER — Ambulatory Visit (INDEPENDENT_AMBULATORY_CARE_PROVIDER_SITE_OTHER): Payer: Commercial Managed Care - HMO | Admitting: Cardiology

## 2015-12-21 ENCOUNTER — Encounter: Payer: Self-pay | Admitting: Cardiology

## 2015-12-21 VITALS — BP 106/60 | HR 92 | Ht 70.0 in | Wt 194.4 lb

## 2015-12-21 DIAGNOSIS — R600 Localized edema: Secondary | ICD-10-CM | POA: Diagnosis not present

## 2015-12-21 DIAGNOSIS — I4892 Unspecified atrial flutter: Secondary | ICD-10-CM | POA: Diagnosis not present

## 2015-12-21 DIAGNOSIS — Z7901 Long term (current) use of anticoagulants: Secondary | ICD-10-CM

## 2015-12-21 DIAGNOSIS — I480 Paroxysmal atrial fibrillation: Secondary | ICD-10-CM

## 2015-12-21 NOTE — Patient Instructions (Signed)

## 2015-12-21 NOTE — Progress Notes (Signed)
Patient ID: John Cherry, male   DOB: 1917-11-14, 82 y.o.   MRN: [id number]        [address] [place], [place]  [address] Phone: [phone] Fax:  [phone]  Date:  12/21/2015   ID:  John Cherry, DOB October 20, 1917, MRN [id number]  PCP:  Mathews Argyle, MD   History of Present Illness: [name] is a 82 y.o. male with chronic atrial fibrillation/flutter on chronic anticoagulation with history of diastolic heart failure while in [place] here for follow-up. Chronic and a degree of venous insufficiency as well. Thankfully, BNP was 98. Hemoglobin 13.3. Creatinine is 1.12- 1.3 to with potassium of 4.1. He has been on twice a day Lasix 40 mg.  05/21/2011 wife died. At the end of 2011-11-19 went into hospital. Diastolic HF episode, ?PNA. TURP 11/13. Rehospitalization because of cholecystitis. Daughter is Firefighter. Hypotension with anesthesia. Never took gall bladder out because of hypotension. Wanted to move here. 04/09/13. [facility].   [place]. Enjoys PT.   Cut back on medications. Dr. Gwenette Greet. Very little COPD. Off nebs. Off neurontin, cause swelling. Off digoxin. Currently doing quite well. Has a strong will to live. Wants to live to 89.  No bleeding on Eliquis. Overall doing quite well. No chest pain, no shortness of breath, no syncope, no fevers.  Doing well, enjoyed [place].   Was in a wedding in [place]. Walked to the hotel. Overall doing well. Daughter moving to [place]. Second row.     Wt Readings from Last 3 Encounters:  12/21/15 194 lb 6.4 oz (88.179 kg)  06/15/15 198 lb 1.9 oz (89.867 kg)  12/17/14 192 lb 12.8 oz (87.454 kg)     Past Medical History  Diagnosis Date  . CHF (congestive heart failure) ([place])   . COPD (chronic obstructive pulmonary disease) ([place])   . Atrial fibrillation ([place])   . Presbycusis   . H/O TB (tuberculosis)     as a teenager  . Abnormal  mini-mental status exam February 2014    24/30    Past Surgical History  Procedure Laterality Date  . Transurethral resection of prostate  November 2013  . Ct perc cholecystostomy  February 2014    Current Outpatient Prescriptions  Medication Sig Dispense Refill  . apixaban (ELIQUIS) 2.5 MG TABS tablet Take 2.5 mg by mouth 2 (two) times daily.    Marland Kitchen diltiazem (DILACOR XR) 120 MG 24 hr capsule Take 120 mg by mouth daily.    . furosemide (LASIX) 40 MG tablet Take 40 mg by mouth 2 (two) times daily.     . potassium chloride (K-DUR) 10 MEQ tablet Take 10 mEq by mouth 2 (two) times daily.      No current facility-administered medications for this visit.    Allergies:    Allergies  Allergen Reactions  . Flonase [Fluticasone Propionate]   . Sulfa Antibiotics     Social History:  The patient  reports that he quit smoking about 51 years ago. His smoking use included Cigarettes. He has a 25 pack-year smoking history. He does not have any smokeless tobacco history on file. He reports that he drinks alcohol. He reports that he does not use illicit drugs.   Family history is noncontributory.  ROS:  Please see the history of present illness.   Denies any fevers, chills, orthopnea, PND, syncope, rashes. He does have some short-term memory loss  but overall judgment is quite good. COPD has been present. Chronic bronchitis.    All other systems reviewed and negative.   PHYSICAL EXAM: VS:  BP 106/60 mmHg  Pulse 92  Ht 5\' 10"  (1.778 m)  Wt 194 lb 6.4 oz (88.179 kg)  BMI 27.89 kg/m2 Well nourished, well developed, in no acute distress, elderly male, uses walker HEENT: normalNCAT, EOMI Neck: no JVDNo obvious carotid bruits, normal upstroke Cardiac:  Mildly irregular, at times tachycardic.; soft systolic murmur right upper sternal border Lungs: mild wheezing bilaterally, rhonchi or ralesNormal respiratory effort Abd: soft, nontender, no hepatomegaly Ext: Ankle/pedal 1+ edema Skin: warm and  dry GU: Deferred Neuro: no focal abnormalities noted  EKG:  EKG ordered today 12/21/15-HR fibrillation/flutter 92 bpm, old septal infarct-like pattern. Personally viewed. Prior- Atrial flutter heart rate of 60, 4-1 conduction, left axis deviation, poor R wave progression.   Lab work-recent creatinine 1.1.  ASSESSMENT AND PLAN:  82 year old with chronic atrial fibrillation/flutter, chronic anticoagulation, chronic venous insufficiency/lower extremity edema, chronic kidney disease stage III.  1. Atrial flutter/fibrillation- Low-dose diltiazem. He is very well rate controlled. No high risk symptoms such as syncope. Doing well. 4:1 conduction previously, today variable. 2. Chronic anticoagulation-doing well on low-dose Eliquis. No bleeding. In fact, had Mohs surgery on left face without difficulty. Did not have to stop Eliquis. No serious falls. 3. Chronic diastolic heart failure-actually, BNP was 92, normal. I will continue with Lasix 40 mg BID. Continue with daily weights. Creatinine is reassuring.. Monitor salt,  fluids. I explained to them that overdiuresis may result in more difficulties such as dizziness, hypotension, worsening renal function. He tried to use compression hose but this was too challenging. 4. COPD-prior end expiratory wheezes heard throughout lung fields. He is actually doing quite well, ambulating well.  Pulmonary has stopped nebs. Zyrtec helped. No MDI's currently. 5. Chronic venous insufficiency/edema-we gave him a prescription for compression hose previously. Has not needed to use these. Challenging to put on. 6. 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   Prior medical records reviewed, we will see him back in 6 months.  Signed, Candee Furbish, MD [facility]  12/21/2015 10:21 AM

## 2016-03-21 DIAGNOSIS — H5200 Hypermetropia, unspecified eye: Secondary | ICD-10-CM | POA: Diagnosis not present

## 2016-03-21 DIAGNOSIS — H5210 Myopia, unspecified eye: Secondary | ICD-10-CM | POA: Diagnosis not present

## 2016-04-04 DIAGNOSIS — H353131 Nonexudative age-related macular degeneration, bilateral, early dry stage: Secondary | ICD-10-CM | POA: Diagnosis not present

## 2016-04-11 DIAGNOSIS — D225 Melanocytic nevi of trunk: Secondary | ICD-10-CM | POA: Diagnosis not present

## 2016-04-11 DIAGNOSIS — D045 Carcinoma in situ of skin of trunk: Secondary | ICD-10-CM | POA: Diagnosis not present

## 2016-04-11 DIAGNOSIS — I872 Venous insufficiency (chronic) (peripheral): Secondary | ICD-10-CM | POA: Diagnosis not present

## 2016-05-09 DIAGNOSIS — Z08 Encounter for follow-up examination after completed treatment for malignant neoplasm: Secondary | ICD-10-CM | POA: Diagnosis not present

## 2016-05-09 DIAGNOSIS — Z85828 Personal history of other malignant neoplasm of skin: Secondary | ICD-10-CM | POA: Diagnosis not present

## 2016-05-16 DIAGNOSIS — I5022 Chronic systolic (congestive) heart failure: Secondary | ICD-10-CM | POA: Diagnosis not present

## 2016-05-16 DIAGNOSIS — I481 Persistent atrial fibrillation: Secondary | ICD-10-CM | POA: Diagnosis not present

## 2016-05-16 DIAGNOSIS — Z79899 Other long term (current) drug therapy: Secondary | ICD-10-CM | POA: Diagnosis not present

## 2016-05-16 DIAGNOSIS — H6123 Impacted cerumen, bilateral: Secondary | ICD-10-CM | POA: Diagnosis not present

## 2016-05-16 DIAGNOSIS — L989 Disorder of the skin and subcutaneous tissue, unspecified: Secondary | ICD-10-CM | POA: Diagnosis not present

## 2016-06-26 ENCOUNTER — Encounter: Payer: Self-pay | Admitting: Cardiology

## 2016-06-26 ENCOUNTER — Ambulatory Visit (INDEPENDENT_AMBULATORY_CARE_PROVIDER_SITE_OTHER): Payer: Commercial Managed Care - HMO | Admitting: Cardiology

## 2016-06-26 VITALS — BP 122/82 | HR 96 | Ht 71.0 in | Wt 196.6 lb

## 2016-06-26 DIAGNOSIS — Z7901 Long term (current) use of anticoagulants: Secondary | ICD-10-CM | POA: Diagnosis not present

## 2016-06-26 DIAGNOSIS — I480 Paroxysmal atrial fibrillation: Secondary | ICD-10-CM | POA: Diagnosis not present

## 2016-06-26 DIAGNOSIS — R600 Localized edema: Secondary | ICD-10-CM

## 2016-06-26 NOTE — Progress Notes (Signed)
Patient ID: John Cherry, male   DOB: [birth year], 82 y.o.   MRN: [id number]        [address] [place], [place]  [address] Phone: [phone] Fax:  [phone]  Date:  06/26/2016   ID:  John Cherry, DOB 08/04/1918, MRN [id number]  PCP:  Mathews Argyle, MD   History of Present Illness: John Cherry is a 82 y.o. male with chronic atrial fibrillation/flutter on chronic anticoagulation with history of diastolic heart failure while in [place] here for follow-up. Chronic and a degree of venous insufficiency as well. Thankfully, BNP was 98. Hemoglobin 13.3. Creatinine is 1.12- 1.3 to with potassium of 4.1. He has been on twice a day Lasix 40 mg.  06-18-2011 wife died. At the end of November 16, 2011 went into hospital. Diastolic HF episode, ?PNA. TURP 11/13. Rehospitalization because of cholecystitis. Daughter is Firefighter. Hypotension with anesthesia. Never took gall bladder out because of hypotension. Wanted to move here. 04/09/13. [facility].   Cut back on medications. Very little COPD. Off nebs. Off neurontin, cause swelling. Off digoxin. Currently doing quite well. Has a strong will to live. Wants to live to 89, he previously said.  No bleeding on Eliquis. Overall doing quite well. No chest pain, no shortness of breath, no syncope, no fevers.  Doing well, enjoyed going to [place] previously.   Was in a wedding in [place]. Walked to the hotel. Overall doing well. Daughter moving to [place]. Second row.     Wt Readings from Last 3 Encounters:  06/26/16 196 lb 9.6 oz (89.2 kg)  12/21/15 194 lb 6.4 oz (88.2 kg)  06/15/15 198 lb 1.9 oz (89.9 kg)     Past Medical History:  Diagnosis Date  . Abnormal mini-mental status exam February 2014   24/30  . Atrial fibrillation ([place])   . CHF (congestive heart failure) ([place])   . COPD (chronic obstructive pulmonary disease) ([place])   . H/O TB (tuberculosis)    as a teenager  . Presbycusis      Past Surgical History:  Procedure Laterality Date  . CT [facility] CHOLECYSTOSTOMY  February 2014  . TRANSURETHRAL RESECTION OF PROSTATE  November 2013    Current Outpatient Prescriptions  Medication Sig Dispense Refill  . apixaban (ELIQUIS) 2.5 MG TABS tablet Take 2.5 mg by mouth 2 (two) times daily.    Marland Kitchen diltiazem (DILACOR XR) 120 MG 24 hr capsule Take 120 mg by mouth daily.    . furosemide (LASIX) 40 MG tablet Take 40 mg by mouth 2 (two) times daily.     . potassium chloride (K-DUR) 10 MEQ tablet Take 10 mEq by mouth 2 (two) times daily.      No current facility-administered medications for this visit.     Allergies:    Allergies  Allergen Reactions  . Flonase [Fluticasone Propionate]     Doesn't agree with him.   . Sulfa Antibiotics     Social History:  The patient  reports that he quit smoking about 52 years ago. His smoking use included Cigarettes. He has a 25.00 pack-year smoking history. He does not have any smokeless tobacco history on file. He reports that he drinks alcohol. He reports that he does not use drugs.   Family history is noncontributory.  ROS:  Please see the history of present illness.   Denies any fevers, chills, orthopnea, PND, syncope, rashes. He does have some short-term memory loss but overall judgment is quite good.  COPD has been present. Chronic bronchitis.    All other systems reviewed and negative.   PHYSICAL EXAM: VS:  BP 122/82   Pulse 96   Ht 5\' 11"  (1.803 m)   Wt 196 lb 9.6 oz (89.2 kg)   BMI 27.42 kg/m  Well nourished, well developed, in no acute distress, elderly male, uses walker HEENT: normal NCAT, EOMI Neck: no JVD No obvious carotid bruits, normal upstroke Cardiac:  Mildly irregular, at times tachycardic.; soft systolic murmur right upper sternal border  Lungs: mild wheezing bilaterally, rhonchi or rales Normal respiratory effort Abd: soft, nontender, no hepatomegaly  Ext: Ankle/pedal 1+ edema Skin: warm and dry  GU:  Deferred Neuro: no focal abnormalities noted  EKG:  EKG ordered today 12/21/15-HR fibrillation/flutter 92 bpm, old septal infarct-like pattern. Personally viewed. Prior- Atrial flutter heart rate of 60, 4-1 conduction, left axis deviation, poor R wave progression.   Lab work-recent creatinine 1.1.  ASSESSMENT AND PLAN:  82 year old with chronic atrial fibrillation/flutter, chronic anticoagulation, chronic venous insufficiency/lower extremity edema, chronic kidney disease stage III.  1. Atrial flutter/fibrillation- Low-dose diltiazem. He is very well rate controlled. No high risk symptoms such as syncope. Doing well. 4:1 conduction previously, today variable once again. 2. Chronic anticoagulation-doing well on low-dose Eliquis. No bleeding. In fact, had Mohs surgery previously on left face without difficulty. Did not have to stop Eliquis. No serious falls. 3. Chronic diastolic heart failure-actually, BNP was 92, normal. I will continue with Lasix 40 mg BID. Continue with daily weights. Creatinine is reassuring previously. Monitor salt,  fluids. I explained to them that overdiuresis may result in more difficulties such as dizziness, hypotension, worsening renal function.  4. COPD-previously prior end expiratory wheezes heard throughout lung fields. Today none.   Pulmonary has stopped nebs. Zyrtec helped. No MDI's currently. 5. Chronic venous insufficiency/edema-we gave him a prescription for compression hose previously. Has not needed to use these. Challenging to put on. 6. 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   Prior medical records reviewed, we will see him back in 6 months.  Signed, Candee Furbish, MD [facility]  06/26/2016 1:49 PM

## 2016-06-26 NOTE — Patient Instructions (Signed)

## 2016-07-09 ENCOUNTER — Encounter (HOSPITAL_COMMUNITY): Payer: Self-pay

## 2016-07-09 ENCOUNTER — Emergency Department (HOSPITAL_COMMUNITY): Payer: Commercial Managed Care - HMO

## 2016-07-09 ENCOUNTER — Emergency Department (HOSPITAL_COMMUNITY)
Admission: EM | Admit: 2016-07-09 | Discharge: 2016-07-09 | Disposition: A | Discharge status: Home / Self Care | Payer: Commercial Managed Care - HMO | Attending: Emergency Medicine | Admitting: Emergency Medicine

## 2016-07-09 DIAGNOSIS — I509 Heart failure, unspecified: Secondary | ICD-10-CM | POA: Insufficient documentation

## 2016-07-09 DIAGNOSIS — R17 Unspecified jaundice: Secondary | ICD-10-CM

## 2016-07-09 DIAGNOSIS — R7989 Other specified abnormal findings of blood chemistry: Secondary | ICD-10-CM | POA: Diagnosis not present

## 2016-07-09 DIAGNOSIS — R740 Nonspecific elevation of levels of transaminase and lactic acid dehydrogenase [LDH]: Secondary | ICD-10-CM | POA: Insufficient documentation

## 2016-07-09 DIAGNOSIS — R55 Syncope and collapse: Secondary | ICD-10-CM | POA: Diagnosis not present

## 2016-07-09 DIAGNOSIS — R404 Transient alteration of awareness: Secondary | ICD-10-CM | POA: Diagnosis not present

## 2016-07-09 DIAGNOSIS — Z79899 Other long term (current) drug therapy: Secondary | ICD-10-CM | POA: Insufficient documentation

## 2016-07-09 DIAGNOSIS — Z87891 Personal history of nicotine dependence: Secondary | ICD-10-CM | POA: Diagnosis not present

## 2016-07-09 DIAGNOSIS — Z7901 Long term (current) use of anticoagulants: Secondary | ICD-10-CM | POA: Diagnosis not present

## 2016-07-09 DIAGNOSIS — R778 Other specified abnormalities of plasma proteins: Secondary | ICD-10-CM

## 2016-07-09 DIAGNOSIS — R531 Weakness: Secondary | ICD-10-CM | POA: Diagnosis not present

## 2016-07-09 DIAGNOSIS — J449 Chronic obstructive pulmonary disease, unspecified: Secondary | ICD-10-CM | POA: Diagnosis not present

## 2016-07-09 DIAGNOSIS — R7401 Elevation of levels of liver transaminase levels: Secondary | ICD-10-CM

## 2016-07-09 LAB — COMPREHENSIVE METABOLIC PANEL
ALBUMIN: 3.4 g/dL — AB (ref 3.5–5.0)
ALK PHOS: 56 U/L (ref 38–126)
ALT: 21 U/L (ref 17–63)
ANION GAP: 7 (ref 5–15)
AST: 44 U/L — ABNORMAL HIGH (ref 15–41)
BILIRUBIN TOTAL: 1.9 mg/dL — AB (ref 0.3–1.2)
BUN: 18 mg/dL (ref 6–20)
CALCIUM: 8.4 mg/dL — AB (ref 8.9–10.3)
CO2: 33 mmol/L — AB (ref 22–32)
CREATININE: 1.04 mg/dL (ref 0.61–1.24)
Chloride: 98 mmol/L — ABNORMAL LOW (ref 101–111)
GFR calc Af Amer: >60 mL/min (ref >60)
GFR calc non Af Amer: 58 mL/min — ABNORMAL LOW (ref >60)
GLUCOSE: 106 mg/dL — AB (ref 65–99)
Potassium: 5.9 mmol/L — ABNORMAL HIGH (ref 3.5–5.1)
SODIUM: 138 mmol/L (ref 135–145)
TOTAL PROTEIN: 6.4 g/dL — AB (ref 6.5–8.1)

## 2016-07-09 LAB — CBC WITH DIFFERENTIAL/PLATELET
BASOS PCT: 0 %
Basophils Absolute: 0 10*3/uL (ref 0.0–0.1)
EOS ABS: 0.1 10*3/uL (ref 0.0–0.7)
EOS PCT: 1 %
HCT: 42 % (ref 39.0–52.0)
Hemoglobin: 13.8 g/dL (ref 13.0–17.0)
LYMPHS ABS: 2 10*3/uL (ref 0.7–4.0)
Lymphocytes Relative: 23 %
MCH: 31.9 pg (ref 26.0–34.0)
MCHC: 32.9 g/dL (ref 30.0–36.0)
MCV: 97 fL (ref 78.0–100.0)
MONO ABS: 0.8 10*3/uL (ref 0.1–1.0)
MONOS PCT: 9 %
NEUTROS PCT: 67 %
Neutro Abs: 5.5 10*3/uL (ref 1.7–7.7)
PLATELETS: 297 10*3/uL (ref 150–400)
RBC: 4.33 MIL/uL (ref 4.22–5.81)
RDW: 15.7 % — AB (ref 11.5–15.5)
WBC: 8.4 10*3/uL (ref 4.0–10.5)

## 2016-07-09 LAB — TROPONIN I
TROPONIN I: 0.03 ng/mL — AB (ref <0.03)
Troponin I: 0.03 ng/mL (ref <0.03)

## 2016-07-09 LAB — URINALYSIS, ROUTINE W REFLEX MICROSCOPIC
BILIRUBIN URINE: NEGATIVE
Glucose, UA: NEGATIVE mg/dL
HGB URINE DIPSTICK: NEGATIVE
KETONES UR: NEGATIVE mg/dL
Leukocytes, UA: NEGATIVE
NITRITE: NEGATIVE
Protein, ur: NEGATIVE mg/dL
SPECIFIC GRAVITY, URINE: 1.011 (ref 1.005–1.030)
pH: 5.5 (ref 5.0–8.0)

## 2016-07-09 NOTE — ED Notes (Signed)
Pt asking for test results. Pt informed that someone will talk to him about them.

## 2016-07-09 NOTE — ED Notes (Signed)
Pt back from x-ray.

## 2016-07-09 NOTE — ED Notes (Signed)
John Cherry [phone] (daughter)

## 2016-07-09 NOTE — ED Triage Notes (Signed)
Pt arrived via [facility] from [facility] home where he lives alone.  C/o weakness since 1530 and a faint feeling.  Pt A&O on arrival denies any pain.  CBG 115.

## 2016-07-09 NOTE — ED Notes (Signed)
Lab called this RN with a critical troponin of 0.03. MD made aware.

## 2016-07-09 NOTE — ED Provider Notes (Signed)
[facility] DEPT Provider Note   CSN: John [id number] Arrival date & time: 07/09/16  1701     History   Chief Complaint Chief Complaint  Patient presents with  . Weakness    HPI John Cherry is a 82 y.o. male.  He was transferred from his nursing home where he was feeling weak and lightheaded. He is not sure he had a syncopal episode or not. The symptoms started today. He denies chest pain, heaviness, tightness, pressure. He denies fever, chills, sweats. He denies nausea or vomiting. There's been no diarrhea. He denies any urinary difficulty.   The history is provided by the patient.    Past Medical History:  Diagnosis Date  . Abnormal mini-mental status exam February 2014   24/30  . Atrial fibrillation ([place])   . CHF (congestive heart failure) ([place])   . COPD (chronic obstructive pulmonary disease) ([place])   . H/O TB (tuberculosis)    as a teenager  . Presbycusis     Patient Active Problem List   Diagnosis Date Noted  . Pleural effusion, left 06/15/2013  . Atrial flutter ([place]) 05/06/2013  . Chronic airway obstruction, not elsewhere classified 04/21/2013  . CHF (congestive heart failure) ([place]) 04/21/2013  . Atrial fibrillation ([place]) 04/21/2013  . Encounter for long-term (current) use of other medications 04/21/2013  . Edema 04/21/2013  . Memory loss 04/21/2013    Past Surgical History:  Procedure Laterality Date  . CT [facility] CHOLECYSTOSTOMY  February 2014  . TRANSURETHRAL RESECTION OF PROSTATE  November 2013       Home Medications    Prior to Admission medications   Medication Sig Start Date End Date Taking? Authorizing Provider  apixaban (ELIQUIS) 2.5 MG TABS tablet Take 2.5 mg by mouth 2 (two) times daily.    Historical Provider, MD  diltiazem (DILACOR XR) 120 MG 24 hr capsule Take 120 mg by mouth daily.    Historical Provider, MD  furosemide (LASIX) 40 MG tablet Take 40 mg by mouth 2 (two) times daily.     Historical Provider, MD  potassium chloride  (K-DUR) 10 MEQ tablet Take 10 mEq by mouth 2 (two) times daily.  06/07/14   Historical Provider, MD    Family History Family History  Problem Relation Age of Onset  . Lung cancer Father     Social History Social History  Substance Use Topics  . Smoking status: Former Smoker    Packs/day: 1.00    Years: 25.00    Types: Cigarettes    Quit date: 04/21/1964  . Smokeless tobacco: Never Used  . Alcohol use Yes     Comment: Rare     Allergies   Flonase [fluticasone propionate] and Sulfa antibiotics   Review of Systems Review of Systems  All other systems reviewed and are negative.    Physical Exam Updated Vital Signs BP 126/84   Pulse 61   Temp 97.8 F (36.6 C) (Oral)   Resp 25   SpO2 94%   Physical Exam  Nursing note and vitals reviewed.  82 year old male, resting comfortably and in no acute distress. Vital signs are significant for tachypnea. Oxygen saturation is 95%, which is normal. Head is normocephalic and atraumatic. PERRLA, EOMI. Oropharynx is clear. Neck is nontender and supple without adenopathy or JVD. Back is nontender and there is no CVA tenderness. Lungs are clear without rales, wheezes, or rhonchi. Chest is nontender. Heart has regular rate and rhythm without murmur. Abdomen is soft, flat, nontender without masses or  hepatosplenomegaly and peristalsis is normoactive. Extremities have no cyanosis or edema, full range of motion is present. Skin is warm and dry without rash. Neurologic: Mental status is normal, cranial nerves are intact, there are no motor or sensory deficits.  ED Treatments / Results  Labs (all labs ordered are listed, but only abnormal results are displayed) Labs Reviewed  COMPREHENSIVE METABOLIC PANEL - Abnormal; Notable for the following:       Result Value   Potassium 5.9 (*)    Chloride 98 (*)    CO2 33 (*)    Glucose, Bld 106 (*)    Calcium 8.4 (*)    Total Protein 6.4 (*)    Albumin 3.4 (*)    AST 44 (*)    Total  Bilirubin 1.9 (*)    GFR calc non Af Amer 58 (*)    All other components within normal limits  TROPONIN I - Abnormal; Notable for the following:    Troponin I 0.03 (*)    All other components within normal limits  CBC WITH DIFFERENTIAL/PLATELET - Abnormal; Notable for the following:    RDW 15.7 (*)    All other components within normal limits  TROPONIN I - Abnormal; Notable for the following:    Troponin I 0.03 (*)    All other components within normal limits  URINALYSIS, ROUTINE W REFLEX MICROSCOPIC (NOT AT [facility])    EKG  EKG Interpretation  Date/Time:  Monday July 09 2016 17:46:32 EST Ventricular Rate:  93 PR Interval:    QRS Duration: 111 QT Interval:  407 QTC Calculation: 476 R Axis:   41 Text Interpretation:  Atrial flutter/fibrillation Anteroseptal infarct, old No old tracing to compare Confirmed by [facility]  MD, Shadeed Colberg ([id number]) on 07/09/2016 5:56:54 PM       Radiology Dg Chest 2 View  Result Date: 07/09/2016 CLINICAL DATA:  Weakness and syncope today. EXAM: CHEST  2 VIEW COMPARISON:  PA and lateral chest 11/11/2015 on 10/14/2013. FINDINGS: There is chronic volume loss in the left chest with a chronic left effusion, thickening and associated pleural calcification. The right lung is hyperexpanded but clear. Cardiac silhouette is largely obscured. No pneumothorax is identified. Degenerative change is present about the shoulders. IMPRESSION: No acute abnormality. Stable appearance of chronic left fibrothorax. Electronically Signed   By: Inge Rise M.D.   On: 07/09/2016 19:06    Procedures Procedures (including critical care time)  Medications Ordered in ED Medications - No data to display   Initial Impression / Assessment and Plan / ED Course  I have reviewed the triage vital signs and the nursing notes.  Pertinent labs & imaging results that were available during my care of the patient were reviewed by me and considered in my medical decision making (see chart for  details).  Clinical Course    Weakness of uncertain cause. Need to evaluate for possible occult infection-will check chest x-ray and urinalysis. Also, will screen for metabolic disturbance with metabolic panel. As noted that he is on furosemide and potassium. Will check ECG and troponin. Old records are reviewed, and he has a known history of paroxysmal atrial fibrillation for which he is anticoagulated on rivaroxaban. CHADS-VASC Score = 3.  Laboratory workup does show hypokalemia and a hemolyzed specimen. He has no reason for hyperkalemia and this is felt to be factitious. Minimal elevation of AST and elevation and bilirubin are probably related to known cholelithiasis and not clinically significant. Troponin is come back borderline at 0.03. He was held  in the ED for a delta troponin which is unchanged. Orthostatic vital signs showed no significant change in pulse or blood pressure. Family is here and suspect that he had a spell of hypoglycemia. No actual evidence of that during evaluation here. In any case, he is feeling better and shows no sign of any serious problems and is discharged to return to his independent living facility.  Final Clinical Impressions(s) / ED Diagnoses   Final diagnoses:  Weakness  Serum total bilirubin elevated  Elevated transaminase level  Elevated troponin    New Prescriptions New Prescriptions   No medications on file     Delora Fuel, MD [id number]

## 2016-07-09 NOTE — ED Notes (Signed)
Patient transported to X-ray 

## 2016-07-25 DIAGNOSIS — R351 Nocturia: Secondary | ICD-10-CM | POA: Diagnosis not present

## 2016-07-25 DIAGNOSIS — R350 Frequency of micturition: Secondary | ICD-10-CM | POA: Diagnosis not present

## 2016-07-25 DIAGNOSIS — R3915 Urgency of urination: Secondary | ICD-10-CM | POA: Diagnosis not present

## 2016-08-24 DIAGNOSIS — D0439 Carcinoma in situ of skin of other parts of face: Secondary | ICD-10-CM | POA: Diagnosis not present

## 2016-08-24 DIAGNOSIS — L821 Other seborrheic keratosis: Secondary | ICD-10-CM | POA: Diagnosis not present

## 2016-09-19 DIAGNOSIS — R5383 Other fatigue: Secondary | ICD-10-CM | POA: Diagnosis not present

## 2016-10-13 ENCOUNTER — Emergency Department (HOSPITAL_COMMUNITY): Payer: Commercial Managed Care - HMO

## 2016-10-13 ENCOUNTER — Emergency Department (HOSPITAL_COMMUNITY)
Admission: EM | Admit: 2016-10-13 | Discharge: 2016-10-13 | Disposition: A | Discharge status: Home / Self Care | Payer: Commercial Managed Care - HMO | Attending: Emergency Medicine | Admitting: Emergency Medicine

## 2016-10-13 ENCOUNTER — Encounter (HOSPITAL_COMMUNITY): Payer: Self-pay

## 2016-10-13 DIAGNOSIS — R600 Localized edema: Secondary | ICD-10-CM | POA: Diagnosis not present

## 2016-10-13 DIAGNOSIS — S0990XA Unspecified injury of head, initial encounter: Secondary | ICD-10-CM | POA: Diagnosis not present

## 2016-10-13 DIAGNOSIS — W010XXA Fall on same level from slipping, tripping and stumbling without subsequent striking against object, initial encounter: Secondary | ICD-10-CM | POA: Diagnosis not present

## 2016-10-13 DIAGNOSIS — S0181XA Laceration without foreign body of other part of head, initial encounter: Secondary | ICD-10-CM | POA: Diagnosis not present

## 2016-10-13 DIAGNOSIS — J449 Chronic obstructive pulmonary disease, unspecified: Secondary | ICD-10-CM | POA: Insufficient documentation

## 2016-10-13 DIAGNOSIS — Y939 Activity, unspecified: Secondary | ICD-10-CM | POA: Insufficient documentation

## 2016-10-13 DIAGNOSIS — Y92039 Unspecified place in apartment as the place of occurrence of the external cause: Secondary | ICD-10-CM | POA: Insufficient documentation

## 2016-10-13 DIAGNOSIS — Z87891 Personal history of nicotine dependence: Secondary | ICD-10-CM | POA: Insufficient documentation

## 2016-10-13 DIAGNOSIS — S199XXA Unspecified injury of neck, initial encounter: Secondary | ICD-10-CM | POA: Diagnosis not present

## 2016-10-13 DIAGNOSIS — N390 Urinary tract infection, site not specified: Secondary | ICD-10-CM | POA: Diagnosis not present

## 2016-10-13 DIAGNOSIS — S0010XA Contusion of unspecified eyelid and periocular area, initial encounter: Secondary | ICD-10-CM | POA: Diagnosis not present

## 2016-10-13 DIAGNOSIS — Y999 Unspecified external cause status: Secondary | ICD-10-CM | POA: Diagnosis not present

## 2016-10-13 DIAGNOSIS — I509 Heart failure, unspecified: Secondary | ICD-10-CM | POA: Insufficient documentation

## 2016-10-13 DIAGNOSIS — Z7901 Long term (current) use of anticoagulants: Secondary | ICD-10-CM | POA: Insufficient documentation

## 2016-10-13 DIAGNOSIS — W19XXXA Unspecified fall, initial encounter: Secondary | ICD-10-CM

## 2016-10-13 LAB — URINALYSIS, ROUTINE W REFLEX MICROSCOPIC
Bilirubin Urine: NEGATIVE
GLUCOSE, UA: NEGATIVE mg/dL
KETONES UR: NEGATIVE mg/dL
NITRITE: POSITIVE — AB
PROTEIN: NEGATIVE mg/dL
Specific Gravity, Urine: 1.011 (ref 1.005–1.030)
pH: 5 (ref 5.0–8.0)

## 2016-10-13 LAB — CBC WITH DIFFERENTIAL/PLATELET
BASOS ABS: 0 10*3/uL (ref 0.0–0.1)
BASOS PCT: 0 %
EOS ABS: 0 10*3/uL (ref 0.0–0.7)
EOS PCT: 0 %
HCT: 44.9 % (ref 39.0–52.0)
HEMOGLOBIN: 14.4 g/dL (ref 13.0–17.0)
LYMPHS ABS: 1.1 10*3/uL (ref 0.7–4.0)
Lymphocytes Relative: 9 %
MCH: 31.3 pg (ref 26.0–34.0)
MCHC: 32.1 g/dL (ref 30.0–36.0)
MCV: 97.6 fL (ref 78.0–100.0)
Monocytes Absolute: 1.1 10*3/uL — ABNORMAL HIGH (ref 0.1–1.0)
Monocytes Relative: 9 %
NEUTROS PCT: 82 %
Neutro Abs: 10.1 10*3/uL — ABNORMAL HIGH (ref 1.7–7.7)
Platelets: 248 10*3/uL (ref 150–400)
RBC: 4.6 MIL/uL (ref 4.22–5.81)
RDW: 15.7 % — ABNORMAL HIGH (ref 11.5–15.5)
WBC: 12.4 10*3/uL — AB (ref 4.0–10.5)

## 2016-10-13 LAB — BASIC METABOLIC PANEL
Anion gap: 12 (ref 5–15)
BUN: 23 mg/dL — ABNORMAL HIGH (ref 6–20)
CHLORIDE: 98 mmol/L — AB (ref 101–111)
CO2: 31 mmol/L (ref 22–32)
Calcium: 8.7 mg/dL — ABNORMAL LOW (ref 8.9–10.3)
Creatinine, Ser: 1.05 mg/dL (ref 0.61–1.24)
GFR calc non Af Amer: 58 mL/min — ABNORMAL LOW (ref >60)
Glucose, Bld: 121 mg/dL — ABNORMAL HIGH (ref 65–99)
POTASSIUM: 3.9 mmol/L (ref 3.5–5.1)
SODIUM: 141 mmol/L (ref 135–145)

## 2016-10-13 MED ORDER — LIDOCAINE HCL 2 % IJ SOLN
5.0000 mL | Freq: Once | INTRAMUSCULAR | Status: AC
  Administered 2016-10-13: 400 mg via INTRADERMAL
  Filled 2016-10-13: qty 20

## 2016-10-13 MED ORDER — ACETAMINOPHEN 325 MG PO TABS
650.0000 mg | ORAL_TABLET | Freq: Once | ORAL | Status: AC
  Administered 2016-10-13: 650 mg via ORAL
  Filled 2016-10-13: qty 2

## 2016-10-13 MED ORDER — LIDOCAINE-EPINEPHRINE (PF) 2 %-1:200000 IJ SOLN
20.0000 mL | Freq: Once | INTRAMUSCULAR | Status: AC
  Administered 2016-10-13: 20 mL via INTRADERMAL
  Filled 2016-10-13: qty 20

## 2016-10-13 MED ORDER — CEPHALEXIN 250 MG PO CAPS
500.0000 mg | ORAL_CAPSULE | Freq: Once | ORAL | Status: AC
  Administered 2016-10-13: 500 mg via ORAL
  Filled 2016-10-13: qty 2

## 2016-10-13 MED ORDER — CEPHALEXIN 500 MG PO CAPS: 500.0000 mg | ORAL_CAPSULE | Freq: Three times a day (TID) | ORAL | 0 refills | Status: DC

## 2016-10-13 NOTE — ED Provider Notes (Addendum)
[place] DEPT Provider Note   CSN: [id number] Arrival date & time: 10/13/16  1420     History   Chief Complaint Chief Complaint  Patient presents with  . Fall  . Head Laceration   History is obtained from patient and from patient's daughter HPI John Cherry is a 83 y.o. male.  HPI Patient tripped while using his walker today in the bathroom of his apartment at approximately 12 noon striking his head. He felt well prior to the event. No other injury. He suffered a laceration to his forehead as result the event. He complains of pain at site of forehead laceration. No other complaint. He denies neck pain denies chest pain denies abdominal pain denies lightheadedness. No other associated symptoms. Of 5 EMS. No treatment prior to coming here. Patient has full recall the event. He denies loss of consciousness or syncope Past Medical History:  Diagnosis Date  . Abnormal mini-mental status exam February 2014   24/30  . Atrial fibrillation ([place])   . CHF (congestive heart failure) ([place])   . COPD (chronic obstructive pulmonary disease) ([place])   . H/O TB (tuberculosis)    as a teenager  . Presbycusis     Patient Active Problem List   Diagnosis Date Noted  . Pleural effusion, left 06/15/2013  . Atrial flutter ([place]) 05/06/2013  . Chronic airway obstruction, not elsewhere classified 04/21/2013  . CHF (congestive heart failure) ([place]) 04/21/2013  . Atrial fibrillation ([place]) 04/21/2013  . Encounter for long-term (current) use of other medications 04/21/2013  . Edema 04/21/2013  . Memory loss 04/21/2013    Past Surgical History:  Procedure Laterality Date  . CT [facility] CHOLECYSTOSTOMY  February 2014  . TRANSURETHRAL RESECTION OF PROSTATE  November 2013       Home Medications    Prior to Admission medications   Medication Sig Start Date End Date Taking? Authorizing Provider  apixaban (ELIQUIS) 2.5 MG TABS tablet Take 2.5 mg by mouth 2 (two) times daily.    Historical  Provider, MD  diltiazem (DILACOR XR) 120 MG 24 hr capsule Take 120 mg by mouth daily.    Historical Provider, MD  furosemide (LASIX) 40 MG tablet Take 40 mg by mouth 2 (two) times daily.     Historical Provider, MD  potassium chloride (K-DUR) 10 MEQ tablet Take 10 mEq by mouth 2 (two) times daily.  06/07/14   Historical Provider, MD    Family History Family History  Problem Relation Age of Onset  . Lung cancer Father     Social History Social History  Substance Use Topics  . Smoking status: Former Smoker    Packs/day: 1.00    Years: 25.00    Types: Cigarettes    Quit date: 04/21/1964  . Smokeless tobacco: Never Used  . Alcohol use Yes     Comment: Rare     Allergies   Flonase [fluticasone propionate] and Sulfa antibiotics   Review of Systems Review of Systems  Respiratory: Positive for wheezing.        Chronic wheezes per his daughter over several years  Cardiovascular: Positive for leg swelling.       Chronic leg edema bilaterally  Musculoskeletal: Positive for gait problem.       Walks with walker  Skin: Positive for wound.       Forehead laceration  Hematological: Bruises/bleeds easily.  All other systems reviewed and are negative.    Physical Exam Updated Vital Signs BP 145/94   Pulse 117  Temp 98.3 F (36.8 C) (Oral)   Resp (!) 30   SpO2 94%   Physical Exam  Constitutional: He appears well-developed and well-nourished.  HENT:  5 cm laceration at right forehead going through the eyebrow. Dermis the base otherwise normocephalic atraumatic  Eyes: Conjunctivae are normal. Pupils are equal, round, and reactive to light.  Neck: Neck supple. No JVD present. No tracheal deviation present. No thyromegaly present.  No tenderness. No bruit  Cardiovascular: Regular rhythm.   No murmur heard. Mildly tachycardic  Pulmonary/Chest: Effort normal. He has wheezes.  Mild expiratory wheezes  Abdominal: Soft. Bowel sounds are normal. He exhibits no distension. There  is no tenderness.  Musculoskeletal: Normal range of motion. He exhibits edema. He exhibits no tenderness.  Entire spine nontender. Pelvis stable nontender. All 4 extremities without contusion abrasion or tenderness. 1+ pretibial pitting edema bilaterally  Neurological: He is alert. He exhibits normal muscle tone. Coordination normal.  Alert cranial nerves II through XII intact. Moves all extremity as well.  Skin: Skin is warm and dry. No rash noted.  Psychiatric: He has a normal mood and affect.  Nursing note and vitals reviewed.    ED Treatments / Results  Labs (all labs ordered are listed, but only abnormal results are displayed) Labs Reviewed  CBC WITH DIFFERENTIAL/PLATELET  BASIC METABOLIC PANEL  URINALYSIS, ROUTINE W REFLEX MICROSCOPIC    EKG  EKG Interpretation None      Results for orders placed or performed during the hospital encounter of 10/13/16  CBC with Differential/Platelet  Result Value Ref Range   WBC 12.4 (H) 4.0 - 10.5 K/uL   RBC 4.60 4.22 - 5.81 MIL/uL   Hemoglobin 14.4 13.0 - 17.0 g/dL   HCT 44.9 39.0 - 52.0 %   MCV 97.6 78.0 - 100.0 fL   MCH 31.3 26.0 - 34.0 pg   MCHC 32.1 30.0 - 36.0 g/dL   RDW 15.7 (H) 11.5 - 15.5 %   Platelets 248 150 - 400 K/uL   Neutrophils Relative % 82 %   Neutro Abs 10.1 (H) 1.7 - 7.7 K/uL   Lymphocytes Relative 9 %   Lymphs Abs 1.1 0.7 - 4.0 K/uL   Monocytes Relative 9 %   Monocytes Absolute 1.1 (H) 0.1 - 1.0 K/uL   Eosinophils Relative 0 %   Eosinophils Absolute 0.0 0.0 - 0.7 K/uL   Basophils Relative 0 %   Basophils Absolute 0.0 0.0 - 0.1 K/uL  Basic metabolic panel  Result Value Ref Range   Sodium 141 135 - 145 mmol/L   Potassium 3.9 3.5 - 5.1 mmol/L   Chloride 98 (L) 101 - 111 mmol/L   CO2 31 22 - 32 mmol/L   Glucose, Bld 121 (H) 65 - 99 mg/dL   BUN 23 (H) 6 - 20 mg/dL   Creatinine, Ser 1.05 0.61 - 1.24 mg/dL   Calcium 8.7 (L) 8.9 - 10.3 mg/dL   GFR calc non Af Amer 58 (L) >60 mL/min   GFR calc Af Amer >60  >60 mL/min   Anion gap 12 5 - 15  Urinalysis, Routine w reflex microscopic  Result Value Ref Range   Color, Urine YELLOW YELLOW   APPearance CLOUDY (A) CLEAR   Specific Gravity, Urine 1.011 1.005 - 1.030   pH 5.0 5.0 - 8.0   Glucose, UA NEGATIVE NEGATIVE mg/dL   Hgb urine dipstick MODERATE (A) NEGATIVE   Bilirubin Urine NEGATIVE NEGATIVE   Ketones, ur NEGATIVE NEGATIVE mg/dL   Protein, ur NEGATIVE  NEGATIVE mg/dL   Nitrite POSITIVE (A) NEGATIVE   Leukocytes, UA LARGE (A) NEGATIVE   RBC / HPF 0-5 0 - 5 RBC/hpf   WBC, UA TOO NUMEROUS TO COUNT 0 - 5 WBC/hpf   Bacteria, UA FEW (A) NONE SEEN   Squamous Epithelial / LPF 0-5 (A) NONE SEEN   WBC Clumps PRESENT    Ct Head Wo Contrast  Result Date: 10/13/2016 CLINICAL DATA:  Unwitnessed fall. EXAM: CT HEAD WITHOUT CONTRAST CT CERVICAL SPINE WITHOUT CONTRAST TECHNIQUE: Multidetector CT imaging of the head and cervical spine was performed following the standard protocol without intravenous contrast. Multiplanar CT image reconstructions of the cervical spine were also generated. COMPARISON:  No comparison studies available. FINDINGS: CT HEAD FINDINGS Brain: There is no evidence for acute hemorrhage, hydrocephalus, mass lesion, or abnormal extra-axial fluid collection. No definite CT evidence for acute infarction. Diffuse loss of parenchymal volume is consistent with atrophy. Patchy low attenuation in the deep hemispheric and periventricular white matter is nonspecific, but likely reflects chronic microvascular ischemic demyelination. Vascular: Atherosclerotic calcification is visualized in the carotid arteries. No dense MCA sign. Major dural sinuses are unremarkable. Skull: No evidence for fracture. No worrisome lytic or sclerotic lesion. Sinuses/Orbits: The visualized paranasal sinuses and mastoid air cells are clear. Visualized portions of the globes and intraorbital fat are unremarkable. Other: None. CT CERVICAL SPINE FINDINGS Alignment: Mild  straightening lower cervical lordosis. No subluxation. Skull base and vertebrae: No evidence for fracture from the skullbase to the T3 vertebral body. Soft tissues and spinal canal: No prevertebral fluid or swelling. No visible canal hematoma. Disc levels: Near complete loss of disc height is seen at C4-5, C5-6 and to a lesser degree at C6-7. Endplate spurring/degeneration is seen at all 3 levels. Degenerative changes also noted at C7-T1. Facets are well aligned bilaterally without substantial facet osteoarthritis evident. Upper chest: Volume loss noted left hemithorax with calcific pleural-parenchymal scarring in the left apex. Left pleural thickening with calcification was noted on chest x-ray from 07/09/2016. Other: None. IMPRESSION: 1. No acute intracranial abnormality. 2. Atrophy with chronic small vessel white matter ischemic disease. 3. Degenerative changes mid cervical spine without acute fracture. 4. Chronic pleural thickening and volume loss in the left hemithorax with calcified left apical pleural-parenchymal scarring. Electronically Signed   By: Misty Stanley M.D.   On: 10/13/2016 15:59   Ct Cervical Spine Wo Contrast  Result Date: 10/13/2016 CLINICAL DATA:  Unwitnessed fall. EXAM: CT HEAD WITHOUT CONTRAST CT CERVICAL SPINE WITHOUT CONTRAST TECHNIQUE: Multidetector CT imaging of the head and cervical spine was performed following the standard protocol without intravenous contrast. Multiplanar CT image reconstructions of the cervical spine were also generated. COMPARISON:  No comparison studies available. FINDINGS: CT HEAD FINDINGS Brain: There is no evidence for acute hemorrhage, hydrocephalus, mass lesion, or abnormal extra-axial fluid collection. No definite CT evidence for acute infarction. Diffuse loss of parenchymal volume is consistent with atrophy. Patchy low attenuation in the deep hemispheric and periventricular white matter is nonspecific, but likely reflects chronic microvascular ischemic  demyelination. Vascular: Atherosclerotic calcification is visualized in the carotid arteries. No dense MCA sign. Major dural sinuses are unremarkable. Skull: No evidence for fracture. No worrisome lytic or sclerotic lesion. Sinuses/Orbits: The visualized paranasal sinuses and mastoid air cells are clear. Visualized portions of the globes and intraorbital fat are unremarkable. Other: None. CT CERVICAL SPINE FINDINGS Alignment: Mild straightening lower cervical lordosis. No subluxation. Skull base and vertebrae: No evidence for fracture from the skullbase to the T3 vertebral  body. Soft tissues and spinal canal: No prevertebral fluid or swelling. No visible canal hematoma. Disc levels: Near complete loss of disc height is seen at C4-5, C5-6 and to a lesser degree at C6-7. Endplate spurring/degeneration is seen at all 3 levels. Degenerative changes also noted at C7-T1. Facets are well aligned bilaterally without substantial facet osteoarthritis evident. Upper chest: Volume loss noted left hemithorax with calcific pleural-parenchymal scarring in the left apex. Left pleural thickening with calcification was noted on chest x-ray from 07/09/2016. Other: None. IMPRESSION: 1. No acute intracranial abnormality. 2. Atrophy with chronic small vessel white matter ischemic disease. 3. Degenerative changes mid cervical spine without acute fracture. 4. Chronic pleural thickening and volume loss in the left hemithorax with calcified left apical pleural-parenchymal scarring. Electronically Signed   By: Misty Stanley M.D.   On: 10/13/2016 15:59   Radiology No results found.  Procedures Procedures (including critical care time)  Medications Ordered in ED Medications - No data to display   Initial Impression / Assessment and Plan / ED Course  I have reviewed the triage vital signs and the nursing notes.  Pertinent labs & imaging results that were available during my care of the patient were reviewed by me and considered in  my medical decision making (see chart for details).     Patient and  Family refused chest x-ray. Patient also complained of low back pain when being moved onto x-ray table. He declined back x-rays. At 5:40 PM patient is alert [place] glascow coma  score 15 ambulates with a walker without difficulty and not lightheaded on standing. resrpiratory rate counted at 12 breaths per minute by me.Denies dyspnea He has a mild resting tachycardia. His daughter reports to me that he had a urinary tract infection diagnosed February 2018, treated with Cipro but his primary care physician told him to stop the antibiotic. Urine was cultured and presumably positive for pseudomonas however antibiotic treatment was stopped and he was instructed to drink copious fluids and avoid hospitalization for intravenous antibiotics. Patient has urinary tract infection today. I offered hospitalization for IV antibiotics with patient and family declined. Urine will be recultured. Prescription Keflex. He has follow-up appointment with Dr. Felipa Eth, his primary care physician in 3 days. His daughter will be staying with him tonight. LACERATION REPAIR Performed by: Orlie Dakin Authorized by: Orlie Dakin Consent: Verbal consent obtained. Risks and benefits: risks, benefits and alternatives were discussed Consent given by: patient Patient identity confirmed: provided demographic data Prepped and Draped in normal sterile fashion Wound explored  Laceration Location: forehead  Laceration Length: 5cm  No Foreign Bodies seen or palpated  Anesthesia: local infiltration  Local anesthetic: lidocaine 2% with epinephrine  Anesthetic total: 2 ml  Irrigation method: syringe Amount of cleaning: standard  Skin closure: 5-0 Prolene   Number of sutures: 5  Technique: Simple interrupted   Patient tolerance: Patient tolerated the procedure well with no immediate complications. An prescription Keflex. Tylenol for pain. Sutures out  5 days. Keep scheduled appointment with Dr. Felipa Eth in 3 days. Final Clinical Impressions(s) / ED Diagnoses  Diagnosis #1 fall #2 minor closed head trauma #3    Forehead laceration (5 cm) #4 urinary tract infection Final diagnoses:  None    New Prescriptions New Prescriptions   No medications on file     Orlie Dakin, MD 10/13/16 1752    Orlie Dakin, MD 10/13/16 1753    Orlie Dakin, MD 10/13/16 [id number]

## 2016-10-13 NOTE — ED Notes (Signed)
Family refusing XR. MD made aware.

## 2016-10-13 NOTE — ED Notes (Signed)
Pt and family report that he lives in independent living at [facility] and had a fall. Pt reports pushing his button to alert staff. Pt reports falling at noon and remembers falling.

## 2016-10-13 NOTE — ED Notes (Signed)
ED Provider at bedside. 

## 2016-10-13 NOTE — ED Notes (Signed)
Applied anti-biotic ointment on pt wound per Elizabeth(RN) requested by pt family.

## 2016-10-13 NOTE — ED Notes (Signed)
Pt dressed and walked with walker per Dr. Winfred Leeds. Dr. Winfred Leeds as witnessed, watched pt take a few steps with walker.

## 2016-10-13 NOTE — ED Notes (Signed)
Family c/o CT stating that they would not move pt over and made pt slide himself over. Charge notified to speak with CT and family.

## 2016-10-13 NOTE — ED Triage Notes (Signed)
Pt BIB [facility] from [facility] facility. Pt had unwitnessed fall in room today. Pt denies LOC, presents with laceration to right head. Pt on [place]ques, Hx generalized weakness and trouble walking.

## 2016-10-13 NOTE — ED Notes (Signed)
Placed patient on the monitor family is a bedside

## 2016-10-13 NOTE — ED Notes (Signed)
Saline soaked gauze place on pt laceration to right eyebrow. Bleeding controlled. Denies pain.

## 2016-10-13 NOTE — Discharge Instructions (Signed)
Sutures to come out in 5 days. Keep scheduled appointment with Dr. Felipa Eth in 3 days. Take the antibiotic as prescribed. It is okay to take Tylenol 650 mg every 4 hours as needed for pain. Return if condition worsens for any reason.

## 2016-10-13 NOTE — ED Notes (Signed)
Pt returned to room and placed back on monitor.  

## 2016-10-13 NOTE — ED Notes (Signed)
Patient transported to X-ray 

## 2016-10-16 DIAGNOSIS — I5022 Chronic systolic (congestive) heart failure: Secondary | ICD-10-CM | POA: Diagnosis not present

## 2016-10-16 DIAGNOSIS — N3000 Acute cystitis without hematuria: Secondary | ICD-10-CM | POA: Diagnosis not present

## 2016-10-16 DIAGNOSIS — Z1389 Encounter for screening for other disorder: Secondary | ICD-10-CM | POA: Diagnosis not present

## 2016-10-16 DIAGNOSIS — I481 Persistent atrial fibrillation: Secondary | ICD-10-CM | POA: Diagnosis not present

## 2016-10-16 DIAGNOSIS — B372 Candidiasis of skin and nail: Secondary | ICD-10-CM | POA: Diagnosis not present

## 2016-10-16 DIAGNOSIS — Z0000 Encounter for general adult medical examination without abnormal findings: Secondary | ICD-10-CM | POA: Diagnosis not present

## 2016-10-16 DIAGNOSIS — L989 Disorder of the skin and subcutaneous tissue, unspecified: Secondary | ICD-10-CM | POA: Diagnosis not present

## 2016-10-19 DIAGNOSIS — C44622 Squamous cell carcinoma of skin of right upper limb, including shoulder: Secondary | ICD-10-CM | POA: Diagnosis not present

## 2016-10-19 DIAGNOSIS — Z08 Encounter for follow-up examination after completed treatment for malignant neoplasm: Secondary | ICD-10-CM | POA: Diagnosis not present

## 2016-10-19 DIAGNOSIS — Z85828 Personal history of other malignant neoplasm of skin: Secondary | ICD-10-CM | POA: Diagnosis not present

## 2016-10-23 DIAGNOSIS — S0181XA Laceration without foreign body of other part of head, initial encounter: Secondary | ICD-10-CM | POA: Diagnosis not present

## 2016-10-23 DIAGNOSIS — N390 Urinary tract infection, site not specified: Secondary | ICD-10-CM | POA: Diagnosis not present

## 2016-10-24 LAB — SUSCEPTIBILITY, AER + ANAEROB

## 2016-10-24 LAB — URINE CULTURE: SPECIAL REQUESTS: NORMAL

## 2016-10-24 LAB — SUSCEPTIBILITY RESULT

## 2016-10-25 ENCOUNTER — Telehealth: Payer: Self-pay | Admitting: *Deleted

## 2016-10-25 NOTE — Telephone Encounter (Signed)
Post ED Visit - Positive Culture Follow-up  Culture report reviewed by antimicrobial stewardship pharmacist:  []  Elenor Quinones, Pharm.D. []  Heide Guile, Pharm.D., BCPS AQ-ID []  Parks Neptune, Pharm.D., BCPS []  Alycia Rossetti, Pharm.D., BCPS []  [place], Pharm.D., BCPS, AAHIVP []  Legrand Como, Pharm.D., BCPS, AAHIVP []  Salome Arnt, PharmD, BCPS []  Dimitri Ped, PharmD, BCPS []  Vincenza Hews, PharmD, BCPS  Chart reviewed by Janetta Hora, PA for positive urine culture. Treated with Ciprofloxacin and Cephalexin, organism sensitive to the same and no further patient follow-up is required at this time.  Harlon Flor [facility] 10/25/2016, 1:07 PM

## 2016-11-06 DIAGNOSIS — H353131 Nonexudative age-related macular degeneration, bilateral, early dry stage: Secondary | ICD-10-CM | POA: Diagnosis not present

## 2016-11-14 ENCOUNTER — Ambulatory Visit: Payer: Medicare HMO | Attending: Geriatric Medicine | Admitting: Physical Therapy

## 2016-11-14 ENCOUNTER — Encounter: Payer: Self-pay | Admitting: Physical Therapy

## 2016-11-14 DIAGNOSIS — R296 Repeated falls: Secondary | ICD-10-CM | POA: Diagnosis not present

## 2016-11-14 DIAGNOSIS — M6281 Muscle weakness (generalized): Secondary | ICD-10-CM | POA: Diagnosis not present

## 2016-11-14 DIAGNOSIS — R262 Difficulty in walking, not elsewhere classified: Secondary | ICD-10-CM | POA: Diagnosis not present

## 2016-11-14 NOTE — Therapy (Signed)
[place] [place], [place], [address] Phone: [phone]   Fax:  [phone]  Physical Therapy Evaluation  Patient Details  Name: John Cherry MRN: [id number] Date of Birth: Apr 14, 1918 Referring Provider: Lajean Manes  Encounter Date: 11/14/2016      PT End of Session - 11/14/16 1353    Visit Number 1   Date for PT Re-Evaluation 01/14/17   PT Start Time 1310   PT Stop Time 0932   PT Time Calculation (min) 45 min   Activity Tolerance Patient tolerated treatment well;Patient limited by fatigue   Behavior During Therapy [facility] for tasks assessed/performed      Past Medical History:  Diagnosis Date  . Abnormal mini-mental status exam February 2014   24/30  . Atrial fibrillation ([place])   . CHF (congestive heart failure) ([place])   . COPD (chronic obstructive pulmonary disease) ([place])   . H/O TB (tuberculosis)    as a teenager  . Presbycusis     Past Surgical History:  Procedure Laterality Date  . CT [facility] CHOLECYSTOSTOMY  February 2014  . TRANSURETHRAL RESECTION OF PROSTATE  November 2013    There were no vitals filed for this visit.       Subjective Assessment - 11/14/16 1314    Subjective Patient and daughter report an significant UTI and dehydration in the fall and winter.  They report 2 falls over the past 6 months, has been on some different antibiotics that have caused him to feel "bad".  C/O fatigue and weakness.  Lives at [facility].   Limitations Lifting;Standing;Walking;House hold activities   Patient Stated Goals walk better.  be stronger   Currently in Pain? No/denies            [facility] PT Assessment - 11/14/16 0001      Assessment   Medical Diagnosis weakness, difficulty walking   Referring Provider Hal Stoneking   Onset Date/Surgical Date 10/14/16   Prior Therapy no     Precautions   Precautions Fall     Balance Screen   Has the patient fallen in the past 6 months Yes    How many times? 2   Has the patient had a decrease in activity level because of a fear of falling?  No   Is the patient reluctant to leave their home because of a fear of falling?  No     Home Environment   Additional Comments lives in assisted living, has to do some walking      Prior Function   Level of Independence Independent with household mobility with device;Independent with community mobility with device   Vocation Retired   Leisure no exercise     Posture/Postural Control   Posture Comments fwd head, rounded shoulders     ROM / Strength   AROM / PROM / Strength AROM;Strength     AROM   Overall AROM Comments WFL's but slow and gaurded     Strength   Overall Strength Comments Shoulder strength 3+/5, elbows 4/5, hips 4-/5, Knee extension 4/5, knee flexion 3+/5, ankles 3+/5     Palpation   Palpation comment pitting edema in the lower legs     Ambulation/Gait   Gait Comments uses a 4 WW, slow, bent over posture, shuffling gait     Standardized Balance Assessment   Standardized Balance Assessment Berg Balance Test;Timed Up and Go Test     Berg Balance Test   Sit to Stand Needs  minimal aid to stand or to stabilize   Standing Unsupported Unable to stand 30 seconds unassisted   Sitting with Back Unsupported but Feet Supported on Floor or Stool Able to sit safely and securely 2 minutes   Stand to Sit Sits independently, has uncontrolled descent   Transfers Able to transfer with verbal cueing and /or supervision   Standing Unsupported with Eyes Closed Needs help to keep from falling   Standing Ubsupported with Feet Together Needs help to attain position and unable to hold for 15 seconds   From Standing, Reach Forward with Outstretched Arm Loses balance while trying/requires external support   From Standing Position, Pick up Object from Floor Unable to try/needs assist to keep balance   From Standing Position, Turn to Look Behind Over each Shoulder Needs assist to keep from  losing balance and falling   Turn 360 Degrees Needs assistance while turning   Standing Unsupported, Alternately Place Feet on Step/Stool Needs assistance to keep from falling or unable to try   Standing Unsupported, One Foot in [employer] balance while stepping or standing   Standing on One Leg Unable to try or needs assist to prevent fall   Total Score 8     Timed Up and Go Test   Normal TUG (seconds) 53                   [facility] Adult PT Treatment/Exercise - 11/14/16 0001      Exercises   Exercises Knee/Hip     Knee/Hip Exercises: Aerobic   Nustep level 3 x 6 minutes, had to stop using the left arm due to some pain inthe shoulder     Knee/Hip Exercises: Seated   Long Arc Quad 2 sets;10 reps   Long Arc Quad Weight 2 lbs.   Marching 2 sets;10 reps;Both   Marching Weights 2 lbs.                  PT Short Term Goals - 11/14/16 1358      PT SHORT TERM GOAL #1   Title have him try the NuStep where he lives at least 2x/week   Time 8   Period Weeks   Status New           PT Long Term Goals - 11/14/16 1358      PT LONG TERM GOAL #1   Title decrease TUG time to 30 seconds   Time 8   Period Weeks   Status New     PT LONG TERM GOAL #2   Title increase Berg balance score to 20/56   Time 8   Period Weeks   Status New     PT LONG TERM GOAL #3   Title increase LE strength to 4/5   Time 8   Period Weeks   Status New     PT LONG TERM GOAL #4   Title walk 300 feet without rest   Time 8   Period Weeks   Status New               Plan - 11/14/16 1354    Clinical Impression Statement Patient has had a bout with a UTI that has caused significant decrease in his ability to walk, has caused some balance issues that he has fallen 2x in the past 6 months.  His TUG time was 53 seconds, his Berg balance test was 8/56 putting him at a very high risk for falls, he tends to really  push off the chair which can lead to him falling back wards, he was  unable to stand unsupported due to leaning backwards.   Rehab Potential Fair   PT Frequency 2x / week   PT Duration 8 weeks   PT Treatment/Interventions ADLs/Self Care Home Management;Gait training;Stair training;Functional mobility training;Patient/family education;Neuromuscular re-education;Balance training;Therapeutic exercise;Therapeutic activities;Manual techniques   PT Next Visit Plan Will slowly add exercises and balance    Consulted and Agree with Plan of Care Patient      Patient will benefit from skilled therapeutic intervention in order to improve the following deficits and impairments:  Abnormal gait, Cardiopulmonary status limiting activity, Decreased activity tolerance, Decreased balance, Decreased mobility, Decreased endurance, Decreased range of motion, Decreased strength, Difficulty walking, Postural dysfunction, Improper body mechanics  Visit Diagnosis: Difficulty in walking, not elsewhere classified - Plan: PT plan of care cert/re-cert  Muscle weakness (generalized) - Plan: PT plan of care cert/re-cert  Repeated falls - Plan: PT plan of care cert/re-cert      G-Codes - 42/39/53 1402    Functional Assessment Tool Used (Outpatient Only) foto 68% limitation   Functional Limitation Mobility: Walking and moving around   Mobility: Walking and Moving Around Current Status (U0233) At least 60 percent but less than 80 percent impaired, limited or restricted   Mobility: Walking and Moving Around Goal Status [phone]) At least 60 percent but less than 80 percent impaired, limited or restricted       Problem List Patient Active Problem List   Diagnosis Date Noted  . Pleural effusion, left 06/15/2013  . Atrial flutter ([place]) 05/06/2013  . Chronic airway obstruction, not elsewhere classified 04/21/2013  . CHF (congestive heart failure) ([place]) 04/21/2013  . Atrial fibrillation ([place]) 04/21/2013  . Encounter for long-term (current) use of other medications 04/21/2013  . Edema  04/21/2013  . Memory loss 04/21/2013    Sumner Boast., PT 11/14/2016, 2:04 PM  [address] [place], [place], [address] Phone: [phone]   Fax:  [phone]  Name: John Cherry MRN: [id number] Date of Birth: [birth year]

## 2016-11-22 ENCOUNTER — Ambulatory Visit: Payer: Medicare HMO | Admitting: Physical Therapy

## 2016-11-22 ENCOUNTER — Encounter: Payer: Self-pay | Admitting: Physical Therapy

## 2016-11-22 DIAGNOSIS — R296 Repeated falls: Secondary | ICD-10-CM | POA: Diagnosis not present

## 2016-11-22 DIAGNOSIS — M6281 Muscle weakness (generalized): Secondary | ICD-10-CM

## 2016-11-22 DIAGNOSIS — R262 Difficulty in walking, not elsewhere classified: Secondary | ICD-10-CM | POA: Diagnosis not present

## 2016-11-22 NOTE — Therapy (Signed)
[place], [place], [address] Phone: [phone]   Fax:  [phone]  Physical Therapy Treatment  Patient Details  Name: John Cherry MRN: [id number] Date of Birth: [birth year] Referring Provider: Lajean Manes  Encounter Date: 11/22/2016      PT End of Session - 11/22/16 1614    Visit Number 2   Date for PT Re-Evaluation 01/14/17   PT Start Time 1528   PT Stop Time 1613   PT Time Calculation (min) 45 min   Activity Tolerance Patient tolerated treatment well;Patient limited by fatigue   Behavior During Therapy [facility] for tasks assessed/performed      Past Medical History:  Diagnosis Date  . Abnormal mini-mental status exam February 2014   24/30  . Atrial fibrillation ([place])   . CHF (congestive heart failure) ([place])   . COPD (chronic obstructive pulmonary disease) ([place])   . H/O TB (tuberculosis)    as a teenager  . Presbycusis     Past Surgical History:  Procedure Laterality Date  . CT [facility] CHOLECYSTOSTOMY  February 2014  . TRANSURETHRAL RESECTION OF PROSTATE  November 2013    There were no vitals filed for this visit.      Subjective Assessment - 11/22/16 1530    Subjective Patient reports he did well after the last visit, "not sore"   Currently in Pain? No/denies                         [facility] Adult PT Treatment/Exercise - 11/22/16 0001      High Level Balance   High Level Balance Activities Side stepping   High Level Balance Comments standing ball toss with CGA/Min A to stand for balance     Knee/Hip Exercises: Aerobic   Nustep level 3 x 6 minutes, had to stop using the left arm due to some pain inthe shoulder     Knee/Hip Exercises: Machines for Strengthening   Cybex Knee Extension 5# 3x10   Cybex Knee Flexion 20# 3x10     Knee/Hip Exercises: Seated   Long Arc Quad 2 sets;10 reps   Long Arc Quad Weight 2 lbs.   Ball Squeeze 30   Other Seated Knee/Hip  Exercises red tband ankle PF/DF 3x10 each, yellow tband shoulder ER   Marching 2 sets;10 reps;Both   Marching Weights 2 lbs.   Abduction/Adduction  2 sets;10 reps   Abd/Adduction Limitations red tband                  PT Short Term Goals - 11/14/16 1358      PT SHORT TERM GOAL #1   Title have him try the NuStep where he lives at least 2x/week   Time 8   Period Weeks   Status New           PT Long Term Goals - 11/14/16 1358      PT LONG TERM GOAL #1   Title decrease TUG time to 30 seconds   Time 8   Period Weeks   Status New     PT LONG TERM GOAL #2   Title increase Berg balance score to 20/56   Time 8   Period Weeks   Status New     PT LONG TERM GOAL #3   Title increase LE strength to 4/5   Time 8   Period Weeks   Status New     PT  LONG TERM GOAL #4   Title walk 300 feet without rest   Time 8   Period Weeks   Status New               Plan - 11/22/16 1614    Clinical Impression Statement Patient did well with acitivites, needs rest, and some cues to get end ROM.  He has difficulty with low back fatigue with walking and standing.  Needs assist to stand without UE support   PT Next Visit Plan Will slowly add exercises and balance    Consulted and Agree with Plan of Care Patient      Patient will benefit from skilled therapeutic intervention in order to improve the following deficits and impairments:  Abnormal gait, Cardiopulmonary status limiting activity, Decreased activity tolerance, Decreased balance, Decreased mobility, Decreased endurance, Decreased range of motion, Decreased strength, Difficulty walking, Postural dysfunction, Improper body mechanics  Visit Diagnosis: Difficulty in walking, not elsewhere classified  Muscle weakness (generalized)  Repeated falls     Problem List Patient Active Problem List   Diagnosis Date Noted  . Pleural effusion, left 06/15/2013  . Atrial flutter ([place]) 05/06/2013  . Chronic airway  obstruction, not elsewhere classified 04/21/2013  . CHF (congestive heart failure) ([place]) 04/21/2013  . Atrial fibrillation ([place]) 04/21/2013  . Encounter for long-term (current) use of other medications 04/21/2013  . Edema 04/21/2013  . Memory loss 04/21/2013    Sumner Boast., PT 11/22/2016, 4:16 PM  [address] [place], [place], [address] Phone: [phone]   Fax:  [phone]  Name: John Cherry MRN: [id number] Date of Birth: 03-11-1918

## 2016-11-28 ENCOUNTER — Encounter: Payer: Self-pay | Admitting: Physical Therapy

## 2016-11-28 ENCOUNTER — Ambulatory Visit: Payer: Medicare HMO | Admitting: Physical Therapy

## 2016-11-28 DIAGNOSIS — R296 Repeated falls: Secondary | ICD-10-CM

## 2016-11-28 DIAGNOSIS — M6281 Muscle weakness (generalized): Secondary | ICD-10-CM

## 2016-11-28 DIAGNOSIS — R262 Difficulty in walking, not elsewhere classified: Secondary | ICD-10-CM

## 2016-11-28 NOTE — Therapy (Signed)
[place], [place], [address] Phone: [phone]   Fax:  [phone]  Physical Therapy Treatment  Patient Details  Name: John Cherry MRN: [id number] Date of Birth: [birth year] Referring Provider: Lajean Manes  Encounter Date: 11/28/2016      PT End of Session - 11/28/16 1408    Visit Number 3   Date for PT Re-Evaluation 01/14/17   PT Start Time 8921   PT Stop Time 1400   PT Time Calculation (min) 47 min   Activity Tolerance Patient limited by lethargy;Patient tolerated treatment well   Behavior During Therapy [facility] for tasks assessed/performed      Past Medical History:  Diagnosis Date  . Abnormal mini-mental status exam February 2014   24/30  . Atrial fibrillation ([place])   . CHF (congestive heart failure) ([place])   . COPD (chronic obstructive pulmonary disease) ([place])   . H/O TB (tuberculosis)    as a teenager  . Presbycusis     Past Surgical History:  Procedure Laterality Date  . CT [facility] CHOLECYSTOSTOMY  February 2014  . TRANSURETHRAL RESECTION OF PROSTATE  November 2013    There were no vitals filed for this visit.      Subjective Assessment - 11/28/16 1315    Subjective Patient reports that he is tired today.    Currently in Pain? No/denies                         [facility] Adult PT Treatment/Exercise - 11/28/16 0001      High Level Balance   High Level Balance Activities Side stepping   High Level Balance Comments standing ball toss with CGA/Min A to stand for balance     Knee/Hip Exercises: Aerobic   Nustep L 4 x 6 mins     Knee/Hip Exercises: Seated   Long Arc Quad 2 sets;10 reps   Long Arc Quad Weight 2 lbs.   Ball Squeeze 30   Other Seated Knee/Hip Exercises red tband ankle PF/DF 3x10 each, yellow tband shoulder ER   Marching 2 sets;10 reps;Both   Marching Weights 2 lbs.   Hamstring Curl Strengthening;Both;3 sets;10 reps   Hamstring Limitations  red tband   Abduction/Adduction  2 sets;10 reps   Abd/Adduction Limitations red tband     Knee/Hip Exercises: Supine   Other Supine Knee/Hip Exercises Seated forward and lateral toe taps on 4 in step 2 x 10                   PT Short Term Goals - 11/14/16 1358      PT SHORT TERM GOAL #1   Title have him try the NuStep where he lives at least 2x/week   Time 8   Period Weeks   Status New           PT Long Term Goals - 11/14/16 1358      PT LONG TERM GOAL #1   Title decrease TUG time to 30 seconds   Time 8   Period Weeks   Status New     PT LONG TERM GOAL #2   Title increase Berg balance score to 20/56   Time 8   Period Weeks   Status New     PT LONG TERM GOAL #3   Title increase LE strength to 4/5   Time 8   Period Weeks   Status New  PT LONG TERM GOAL #4   Title walk 300 feet without rest   Time 8   Period Weeks   Status New               Plan - 11/28/16 1409    Clinical Impression Statement Patient did well with activites, he needed multiple cues to understand exercises. The patient said he was tired today and was no motivated to be here. His daughter said something the weather plays a big factor into his mood. His daughter also stated that if we could incoperate exercises with pushing airplane pedals that he may be more motivated.    PT Next Visit Plan Cont with exercises and balance      Patient will benefit from skilled therapeutic intervention in order to improve the following deficits and impairments:  Abnormal gait, Cardiopulmonary status limiting activity, Decreased activity tolerance, Decreased balance, Decreased mobility, Decreased endurance, Decreased range of motion, Decreased strength, Difficulty walking, Postural dysfunction, Improper body mechanics  Visit Diagnosis: Difficulty in walking, not elsewhere classified  Muscle weakness (generalized)  Repeated falls     Problem List Patient Active Problem List    Diagnosis Date Noted  . Pleural effusion, left 06/15/2013  . Atrial flutter ([place]) 05/06/2013  . Chronic airway obstruction, not elsewhere classified 04/21/2013  . CHF (congestive heart failure) ([place]) 04/21/2013  . Atrial fibrillation ([place]) 04/21/2013  . Encounter for long-term (current) use of other medications 04/21/2013  . Edema 04/21/2013  . Memory loss 04/21/2013    Alan Mulder SPTA 11/28/2016, 2:14 PM  [place] [address] [place], [place], [address] Phone: [phone]   Fax:  [phone]  Name: John Cherry MRN: [id number] Date of Birth: 1917-08-24

## 2016-11-29 DIAGNOSIS — L304 Erythema intertrigo: Secondary | ICD-10-CM | POA: Diagnosis not present

## 2016-12-05 ENCOUNTER — Ambulatory Visit: Payer: Medicare HMO | Attending: Geriatric Medicine | Admitting: Physical Therapy

## 2016-12-05 ENCOUNTER — Encounter: Payer: Self-pay | Admitting: Physical Therapy

## 2016-12-05 DIAGNOSIS — R262 Difficulty in walking, not elsewhere classified: Secondary | ICD-10-CM | POA: Diagnosis not present

## 2016-12-05 DIAGNOSIS — M6281 Muscle weakness (generalized): Secondary | ICD-10-CM

## 2016-12-05 DIAGNOSIS — R296 Repeated falls: Secondary | ICD-10-CM | POA: Insufficient documentation

## 2016-12-05 NOTE — Therapy (Signed)
[place], [place], [address] Phone: [phone]   Fax:  [phone]  Physical Therapy Treatment  Patient Details  Name: John Cherry MRN: [id number] Date of Birth: 02-Sep-1917 Referring Provider: Lajean Manes  Encounter Date: 12/05/2016      PT End of Session - 12/05/16 1403    Visit Number 4   Date for PT Re-Evaluation 01/14/17   PT Start Time 5361   PT Stop Time 4431   PT Time Calculation (min) 40 min   Activity Tolerance Patient limited by lethargy;Patient tolerated treatment well   Behavior During Therapy [facility] for tasks assessed/performed      Past Medical History:  Diagnosis Date  . Abnormal mini-mental status exam February 2014   24/30  . Atrial fibrillation ([place])   . CHF (congestive heart failure) ([place])   . COPD (chronic obstructive pulmonary disease) ([place])   . H/O TB (tuberculosis)    as a teenager  . Presbycusis     Past Surgical History:  Procedure Laterality Date  . CT [facility] CHOLECY[facility]OSTOMY  February 2014  . TRANSURETHRAL RESECTION OF PROSTATE  November 2013    There were no vitals filed for this visit.      Subjective Assessment - 12/05/16 1316    Subjective Patient reports that he is good today.    Currently in Pain? No/denies                         [facility] Adult PT Treatment/Exercise - 12/05/16 0001      Knee/Hip Exercises: Aerobic   Nustep L 3 x 6 min     Knee/Hip Exercises: Standing   Other Standing Knee Exercises Standing ball tosses 20 reps. Against wall shoulder ext rtband 2 x 10, scap retraction hands low 2 x 15   Other Standing Knee Exercises seated flight simulation include arm,ankle, leg, and processing motions.      Knee/Hip Exercises: Seated   Long Arc Quad 3 sets;15 reps   Long Arc Quad Weight 2 lbs.   Marching 3 sets;15 reps   Marching Limitations 2#   Hamstring Curl Strengthening;Both;3 sets;10 reps   Hamstring Limitations  red tband     Knee/Hip Exercises: Supine   Other Supine Knee/Hip Exercises Seated forward and lateral toe taps on 6 in step 2 x 10    Other Supine Knee/Hip Exercises Seated cone touches forward and across body 2 x 10                   PT Short Term Goals - 11/14/16 1358      PT SHORT TERM GOAL #1   Title have him try the NuStep where he lives at least 2x/week   Time 8   Period Weeks   Status New           PT Long Term Goals - 11/14/16 1358      PT LONG TERM GOAL #1   Title decrease TUG time to 30 seconds   Time 8   Period Weeks   Status New     PT LONG TERM GOAL #2   Title increase Berg balance score to 20/56   Time 8   Period Weeks   Status New     PT LONG TERM GOAL #3   Title increase LE strength to 4/5   Time 8   Period Weeks   Status New  PT LONG TERM GOAL #4   Title walk 300 feet without rest   Time 8   Period Weeks   Status New               Plan - 12/05/16 1404    Clinical Impression Statement Patient did well with activites, he needed continous cues to sit up straight in the chair. We worked on a lot of standing balance with ball tosses then up against the wall for posture purposes to stand up straight. He still required cues to keep his chin up. The patient's son in law came in to help with a flight simulation which worked the patients ankle, arm, leg and cognitive abilites which he handled well. He stated at the end he was worn out.    PT Next Visit Plan Cont with exercises and balance      Patient will benefit from skilled therapeutic intervention in order to improve the following deficits and impairments:  Abnormal gait, Cardiopulmonary status limiting activity, Decreased activity tolerance, Decreased balance, Decreased mobility, Decreased endurance, Decreased range of motion, Decreased strength, Difficulty walking, Postural dysfunction, Improper body mechanics  Visit Diagnosis: Difficulty in walking, not elsewhere  classified  Muscle weakness (generalized)  Repeated falls     Problem List Patient Active Problem List   Diagnosis Date Noted  . Pleural effusion, left 06/15/2013  . Atrial flutter ([place]) 05/06/2013  . Chronic airway obstruction, not elsewhere classified 04/21/2013  . CHF (congestive heart failure) ([place]) 04/21/2013  . Atrial fibrillation ([place]) 04/21/2013  . Encounter for long-term (current) use of other medications 04/21/2013  . Edema 04/21/2013  . Memory loss 04/21/2013    Alan Mulder SPTA 12/05/2016, 2:09 PM  [address] [address] [place], [place], [address] Phone: [phone]   Fax:  [phone]  Name: John Cherry MRN: [id number] Date of Birth: April 10, 1918

## 2016-12-10 DIAGNOSIS — I5022 Chronic systolic (congestive) heart failure: Secondary | ICD-10-CM | POA: Diagnosis not present

## 2016-12-10 DIAGNOSIS — I481 Persistent atrial fibrillation: Secondary | ICD-10-CM | POA: Diagnosis not present

## 2016-12-10 DIAGNOSIS — R109 Unspecified abdominal pain: Secondary | ICD-10-CM | POA: Diagnosis not present

## 2016-12-12 ENCOUNTER — Encounter: Payer: Self-pay | Admitting: Physical Therapy

## 2016-12-12 ENCOUNTER — Ambulatory Visit: Payer: Medicare HMO | Admitting: Physical Therapy

## 2016-12-12 DIAGNOSIS — R296 Repeated falls: Secondary | ICD-10-CM

## 2016-12-12 DIAGNOSIS — R262 Difficulty in walking, not elsewhere classified: Secondary | ICD-10-CM | POA: Diagnosis not present

## 2016-12-12 DIAGNOSIS — M6281 Muscle weakness (generalized): Secondary | ICD-10-CM

## 2016-12-12 NOTE — Therapy (Signed)
[place], [place], [address] Phone: [phone]   Fax:  [phone]  Physical Therapy Treatment  Patient Details  Name: John Cherry MRN: [id number] Date of Birth: June 12, 1918 Referring Provider: Lajean Manes  Encounter Date: 12/12/2016      PT End of Session - 12/12/16 1458    Visit Number 5   Date for PT Re-Evaluation 01/14/17   PT Start Time 3016   PT Stop Time 1400   PT Time Calculation (min) 48 min      Past Medical History:  Diagnosis Date  . Abnormal mini-mental status exam February 2014   24/30  . Atrial fibrillation ([place])   . CHF (congestive heart failure) ([place])   . COPD (chronic obstructive pulmonary disease) ([place])   . H/O TB (tuberculosis)    as a teenager  . Presbycusis     Past Surgical History:  Procedure Laterality Date  . CT [facility] CHOLECYSTOSTOMY  February 2014  . TRANSURETHRAL RESECTION OF PROSTATE  November 2013    There were no vitals filed for this visit.      Subjective Assessment - 12/12/16 1310    Subjective Patient reports that he is worn out.  "I stay that way"   Currently in Pain? No/denies                         [facility] Adult PT Treatment/Exercise - 12/12/16 0001      Ambulation/Gait   Gait Comments 4 steps one at a time, one hand rail and one hand hold assist, gait with 4WW 180', then worked with him and his family on how to get in and out of the car, educated family on the stairs as they are going to a place with stairs this weekend     High Level Balance   High Level Balance Activities Side stepping     Knee/Hip Exercises: Aerobic   Nustep L 4 x 6 min     Knee/Hip Exercises: Machines for Strengthening   Cybex Knee Extension 5# 3x10   Cybex Knee Flexion 20# 3x10     Knee/Hip Exercises: Seated   Other Seated Knee/Hip Exercises red tband ankle PF/DF 3x10 each, yellow tband shoulder ER, toe taps, heel raises in sitting, red  tband scapular stabilizaiton, yellow tband ER this causes left shoulder pain   Marching 3 sets;15 reps   Marching Limitations 2#     Knee/Hip Exercises: Supine   Other Supine Knee/Hip Exercises Seated forward and lateral toe taps on 6 in step 2 x 10    Other Supine Knee/Hip Exercises Seated cone touches forward and across body 2 x 10                   PT Short Term Goals - 12/12/16 1503      PT SHORT TERM GOAL #1   Title have him try the NuStep where he lives at least 2x/week   Status On-going           PT Long Term Goals - 12/12/16 1503      PT LONG TERM GOAL #1   Title decrease TUG time to 30 seconds   Status On-going     PT LONG TERM GOAL #4   Title walk 300 feet without rest   Status On-going               Plan - 12/12/16 1501  Clinical Impression Statement Patient with a general feeling of lethargy, the daughter does report he is fighting a bladder/UTI, he did well on the steps going down 4 and coming up four, the issue was as he go tired he really had more difficulty going up and required some assist.  He has difficulty getting up from sitting sometimes having multiple attempts.   PT Next Visit Plan see how the trip went where he had to go up and down some stairs   Consulted and Agree with Plan of Care Patient      Patient will benefit from skilled therapeutic intervention in order to improve the following deficits and impairments:  Abnormal gait, Cardiopulmonary status limiting activity, Decreased activity tolerance, Decreased balance, Decreased mobility, Decreased endurance, Decreased range of motion, Decreased strength, Difficulty walking, Postural dysfunction, Improper body mechanics  Visit Diagnosis: Difficulty in walking, not elsewhere classified  Muscle weakness (generalized)  Repeated falls     Problem List Patient Active Problem List   Diagnosis Date Noted  . Pleural effusion, left 06/15/2013  . Atrial flutter ([place]) 05/06/2013   . Chronic airway obstruction, not elsewhere classified 04/21/2013  . CHF (congestive heart failure) ([place]) 04/21/2013  . Atrial fibrillation ([place]) 04/21/2013  . Encounter for long-term (current) use of other medications 04/21/2013  . Edema 04/21/2013  . Memory loss 04/21/2013    Sumner Boast., PT 12/12/2016, 3:04 PM  [place] [address] [place], [place], [address] Phone: [phone]   Fax:  [phone]  Name: John Cherry MRN: [id number] Date of Birth: 1918/03/12

## 2016-12-19 ENCOUNTER — Encounter: Payer: Self-pay | Admitting: Physical Therapy

## 2016-12-19 ENCOUNTER — Ambulatory Visit: Payer: Medicare HMO | Admitting: Physical Therapy

## 2016-12-19 DIAGNOSIS — R262 Difficulty in walking, not elsewhere classified: Secondary | ICD-10-CM | POA: Diagnosis not present

## 2016-12-19 DIAGNOSIS — M6281 Muscle weakness (generalized): Secondary | ICD-10-CM | POA: Diagnosis not present

## 2016-12-19 DIAGNOSIS — R296 Repeated falls: Secondary | ICD-10-CM

## 2016-12-19 NOTE — Therapy (Signed)
[place], [place], [address] Phone: [phone]   Fax:  [phone]  Physical Therapy Treatment  Patient Details  Name: JAMOND NEELS MRN: [id number] Date of Birth: Aug 30, 1917 Referring Provider: Lajean Manes  Encounter Date: 12/19/2016      PT End of Session - 12/19/16 1340    Visit Number 6   Date for PT Re-Evaluation 01/14/17   PT Start Time 1301   PT Stop Time 1344   PT Time Calculation (min) 43 min   Activity Tolerance Patient tolerated treatment well   Behavior During Therapy [facility] for tasks assessed/performed      Past Medical History:  Diagnosis Date  . Abnormal mini-mental status exam February 2014   24/30  . Atrial fibrillation ([place])   . CHF (congestive heart failure) ([place])   . COPD (chronic obstructive pulmonary disease) ([place])   . H/O TB (tuberculosis)    as a teenager  . Presbycusis     Past Surgical History:  Procedure Laterality Date  . CT [facility] CHOLECYSTOSTOMY  February 2014  . TRANSURETHRAL RESECTION OF PROSTATE  November 2013    There were no vitals filed for this visit.      Subjective Assessment - 12/19/16 1306    Subjective Pateint went on vacation with the family, they report no falls and he was able to get in and out of car and up and down stairs better   Currently in Pain? No/denies                         [facility] Adult PT Treatment/Exercise - 12/19/16 0001      Knee/Hip Exercises: Aerobic   Nustep L 4 x 6 min     Knee/Hip Exercises: Machines for Strengthening   Cybex Knee Extension 5# 3x10   Cybex Knee Flexion 20# 3x10   Other Machine seated red tband scapular stabilization 2 ways     Knee/Hip Exercises: Standing   Other Standing Knee Exercises seated flight simulation include arm,ankle, leg, and processing motions.      Knee/Hip Exercises: Seated   Other Seated Knee/Hip Exercises red tband ankle PF/DF 3x10 each, yellow tband  shoulder ER, toe taps, heel raises in sitting, red tband scapular stabilizaiton, yellow tband ER this causes left shoulder pain   Marching 3 sets;15 reps   Marching Limitations 2#     Knee/Hip Exercises: Supine   Other Supine Knee/Hip Exercises Seated forward and lateral toe taps on 6 in step 2 x 10                   PT Short Term Goals - 12/19/16 1342      PT SHORT TERM GOAL #1   Title have him try the NuStep where he lives at least 2x/week   Status On-going           PT Long Term Goals - 12/19/16 1342      PT LONG TERM GOAL #1   Title decrease TUG time to 30 seconds   Status On-going     PT LONG TERM GOAL #3   Title increase LE strength to 4/5   Status On-going     PT LONG TERM GOAL #4   Title walk 300 feet without rest   Status On-going               Plan - 12/19/16 1341    Clinical Impression Statement  Pateint seemed to do better today his first day back from a vacation.  He did all exercises that were in sitting without any back support, he had been asking for back support in the past..   PT Next Visit Plan seems to be doing better, add exercises as tolerated, his shoulders are very painful   Consulted and Agree with Plan of Care Patient      Patient will benefit from skilled therapeutic intervention in order to improve the following deficits and impairments:  Abnormal gait, Cardiopulmonary status limiting activity, Decreased activity tolerance, Decreased balance, Decreased mobility, Decreased endurance, Decreased range of motion, Decreased strength, Difficulty walking, Postural dysfunction, Improper body mechanics  Visit Diagnosis: Difficulty in walking, not elsewhere classified  Muscle weakness (generalized)  Repeated falls     Problem List Patient Active Problem List   Diagnosis Date Noted  . Pleural effusion, left 06/15/2013  . Atrial flutter ([place]) 05/06/2013  . Chronic airway obstruction, not elsewhere classified 04/21/2013  .  CHF (congestive heart failure) ([place]) 04/21/2013  . Atrial fibrillation ([place]) 04/21/2013  . Encounter for long-term (current) use of other medications 04/21/2013  . Edema 04/21/2013  . Memory loss 04/21/2013    Sumner Boast., PT 12/19/2016, 1:43 PM  [place] [address] [place], [place], [address] Phone: [phone]   Fax:  [phone]  Name: LIVIO LEDWITH MRN: [id number] Date of Birth: 1918/07/08

## 2016-12-26 ENCOUNTER — Ambulatory Visit: Payer: Medicare HMO | Admitting: Physical Therapy

## 2016-12-26 ENCOUNTER — Encounter: Payer: Self-pay | Admitting: Physical Therapy

## 2016-12-26 DIAGNOSIS — R296 Repeated falls: Secondary | ICD-10-CM

## 2016-12-26 DIAGNOSIS — M6281 Muscle weakness (generalized): Secondary | ICD-10-CM

## 2016-12-26 DIAGNOSIS — R262 Difficulty in walking, not elsewhere classified: Secondary | ICD-10-CM

## 2016-12-26 NOTE — Therapy (Signed)
[place], [place], [address] Phone: [phone]   Fax:  [phone]  Physical Therapy Treatment  Patient Details  Name: John Cherry MRN: [id number] Date of Birth: 06/04/1918 Referring Provider: Lajean Manes  Encounter Date: 12/26/2016      PT End of Session - 12/26/16 1340    Visit Number 7   Date for PT Re-Evaluation 01/14/17   PT Start Time 1301   PT Stop Time 1350   PT Time Calculation (min) 49 min   Activity Tolerance Patient tolerated treatment well   Behavior During Therapy [facility] for tasks assessed/performed      Past Medical History:  Diagnosis Date  . Abnormal mini-mental status exam February 2014   24/30  . Atrial fibrillation ([place])   . CHF (congestive heart failure) ([place])   . COPD (chronic obstructive pulmonary disease) ([place])   . H/O TB (tuberculosis)    as a teenager  . Presbycusis     Past Surgical History:  Procedure Laterality Date  . CT [facility] CHOLECYSTOSTOMY  February 2014  . TRANSURETHRAL RESECTION OF PROSTATE  November 2013    There were no vitals filed for this visit.      Subjective Assessment - 12/26/16 1304    Subjective No reports of any issues, just reports feeling tired and "worn out"   Currently in Pain? No/denies                         [facility] Adult PT Treatment/Exercise - 12/26/16 0001      Knee/Hip Exercises: Aerobic   Nustep L 4 x 7 min did not use arms today due to shoulder pain     Knee/Hip Exercises: Machines for Strengthening   Cybex Knee Extension 5# 3x10   Cybex Knee Flexion 20# 3x10   Other Machine seated red tband scapular stabilization 2 ways     Knee/Hip Exercises: Standing   Hip ADduction 2 sets;10 reps   Hip ADduction Limitations 2#   Other Standing Knee Exercises seated flight simulation include arm,ankle, leg, and processing motions.      Knee/Hip Exercises: Seated   Other Seated Knee/Hip Exercises red  tband ankle PF/DF 3x10 each, yellow tband shoulder ER, toe taps, heel raises in sitting, red tband scapular stabilizaiton, yellow tband ER this causes left shoulder pain   Marching 3 sets;15 reps   Marching Limitations 2#     Knee/Hip Exercises: Supine   Other Supine Knee/Hip Exercises Seated forward and lateral toe taps on 6 in step 2 x 10                   PT Short Term Goals - 12/19/16 1342      PT SHORT TERM GOAL #1   Title have him try the NuStep where he lives at least 2x/week   Status On-going           PT Long Term Goals - 12/26/16 1350      PT LONG TERM GOAL #1   Title decrease TUG time to 30 seconds   Status On-going     PT LONG TERM GOAL #2   Title increase Berg balance score to 20/56   Status On-going     PT LONG TERM GOAL #3   Title increase LE strength to 4/5   Status On-going               Plan - 12/26/16  47    Clinical Impression Statement Pateint did well today, he needed the back support today to be able to do more with less issues of feeling "worn out"  Left Df is very weak, have to be careful with exercises due to left shoulder OA   PT Next Visit Plan add exercises as tolerated, I encouraged him to go to his gym where he lives and try the NuStep   Consulted and Agree with Plan of Care Patient      Patient will benefit from skilled therapeutic intervention in order to improve the following deficits and impairments:  Abnormal gait, Cardiopulmonary status limiting activity, Decreased activity tolerance, Decreased balance, Decreased mobility, Decreased endurance, Decreased range of motion, Decreased strength, Difficulty walking, Postural dysfunction, Improper body mechanics  Visit Diagnosis: Difficulty in walking, not elsewhere classified  Muscle weakness (generalized)  Repeated falls     Problem List Patient Active Problem List   Diagnosis Date Noted  . Pleural effusion, left 06/15/2013  . Atrial flutter ([place]) 05/06/2013   . Chronic airway obstruction, not elsewhere classified 04/21/2013  . CHF (congestive heart failure) ([place]) 04/21/2013  . Atrial fibrillation ([place]) 04/21/2013  . Encounter for long-term (current) use of other medications 04/21/2013  . Edema 04/21/2013  . Memory loss 04/21/2013    Sumner Boast., PT 12/26/2016, 1:51 PM  [place] [address] [place], [place], [address] Phone: [phone]   Fax:  [phone]  Name: SAMIT SYLVE MRN: [id number] Date of Birth: 1918-02-11

## 2017-01-01 ENCOUNTER — Encounter: Payer: Self-pay | Admitting: Cardiology

## 2017-01-01 ENCOUNTER — Ambulatory Visit (INDEPENDENT_AMBULATORY_CARE_PROVIDER_SITE_OTHER): Payer: Medicare HMO | Admitting: Cardiology

## 2017-01-01 VITALS — BP 114/68 | HR 84 | Ht 70.0 in | Wt 185.6 lb

## 2017-01-01 DIAGNOSIS — J438 Other emphysema: Secondary | ICD-10-CM

## 2017-01-01 DIAGNOSIS — Z7901 Long term (current) use of anticoagulants: Secondary | ICD-10-CM | POA: Diagnosis not present

## 2017-01-01 DIAGNOSIS — I4892 Unspecified atrial flutter: Secondary | ICD-10-CM | POA: Diagnosis not present

## 2017-01-01 DIAGNOSIS — R600 Localized edema: Secondary | ICD-10-CM | POA: Diagnosis not present

## 2017-01-01 NOTE — Patient Instructions (Signed)

## 2017-01-01 NOTE — Progress Notes (Signed)
Patient ID: John Cherry, male   DOB: Sep 09, 1917, 83 y.o.   MRN: [id number]        [address] [place], [place]  [address] Phone: [phone] Fax:  [phone]  Date:  01/01/2017   ID:  John Cherry, DOB 1918/01/30, MRN [id number]  PCP:  Lajean Manes, MD   History of Present Illness: John Cherry is a 83 y.o. male with chronic atrial fibrillation/flutter on chronic anticoagulation with history of diastolic heart failure while in [place] here for follow-up. Chronic and a degree of venous insufficiency as well. Thankfully, BNP was 98. Hemoglobin 13.3. Creatinine is 1.12- 1.3 to with potassium of 4.1. He has been on twice a day Lasix 40 mg.  07/05/11 wife died. At the end of 2011/12/03 went into hospital. Diastolic HF episode, ?PNA. TURP 11/13. Rehospitalization because of cholecystitis. Daughter is Firefighter. Hypotension with anesthesia. Never took gall bladder out because of hypotension. Wanted to move here. 04/09/13. [facility].   Cut back on medications. Very little COPD. Off nebs. Off neurontin, cause swelling. Off digoxin. Currently doing quite well. Has a strong will to live. Wants to live to 89, he previously said.  No bleeding on Eliquis. Overall doing quite well. No chest pain, no shortness of breath, no syncope, no fevers.  Doing well, enjoyed going to [place] previously.   Was in a wedding in [place]. Walked to the hotel. Overall doing well. Daughter moving to [place]. Second row.  01/01/17-has been doing quite well. He did have Pseudomonas in his urine, took Cipro then Keflex then Levaquin. His daughter states that the 500 mg dose of Levaquin for 3 days debilitated and quite a bit. However, his cough cleared. No chest pain, no shortness of breath. They were at [facility] and saw mother bear.   Wt Readings from Last 3 Encounters:  01/01/17 185 lb 9.6 oz (84.2 kg)  06/26/16 196 lb 9.6 oz (89.2 kg)  12/21/15 194 lb 6.4 oz (88.2  kg)     Past Medical History:  Diagnosis Date  . Abnormal mini-mental status exam February 2014   24/30  . Atrial fibrillation ([place])   . CHF (congestive heart failure) ([place])   . COPD (chronic obstructive pulmonary disease) ([place])   . H/O TB (tuberculosis)    as a teenager  . Presbycusis     Past Surgical History:  Procedure Laterality Date  . CT [facility] CHOLECYSTOSTOMY  February 2014  . TRANSURETHRAL RESECTION OF PROSTATE  November 2013    Current Outpatient Prescriptions  Medication Sig Dispense Refill  . acetaminophen (TYLENOL) 500 MG tablet Take 500 mg by mouth every 6 (six) hours as needed for headache (pain).    Marland Kitchen apixaban (ELIQUIS) 2.5 MG TABS tablet Take 2.5 mg by mouth 2 (two) times daily.    Marland Kitchen diltiazem (CARTIA XT) 120 MG 24 hr capsule Take 120 mg by mouth daily.    . furosemide (LASIX) 40 MG tablet Take 40 mg by mouth See admin instructions. Take 1 tablet (40 mg) by mouth every morning and at noon    . potassium chloride (K-DUR) 10 MEQ tablet Take 10 mEq by mouth 2 (two) times daily.      No current facility-administered medications for this visit.     Allergies:    Allergies  Allergen Reactions  . Flonase [Fluticasone Propionate] Other (See Comments)    Doesn't agree with him.   . Sulfa Antibiotics Other (See Comments)  Unknown reaction    Social History:  The patient  reports that he quit smoking about 52 years ago. His smoking use included Cigarettes. He has a 25.00 pack-year smoking history. He has never used smokeless tobacco. He reports that he drinks alcohol. He reports that he does not use drugs.   Family history is noncontributory.  ROS:  Please see the history of present illness.   No syncope, no recent falls, no bleeding. Positive for skin cancers. All other systems reviewed and negative.   PHYSICAL EXAM: VS:  BP 114/68   Pulse 84   Ht 5\' 10"  (1.778 m)   Wt 185 lb 9.6 oz (84.2 kg)   BMI 26.63 kg/m  Well nourished, well developed, in no acute  distress, elderly male, uses Walker HEENT: Normal NCAT, EOMI Neck: no JVD No obvious carotid bruits, normal upstroke Cardiac:  Regular; soft systolic murmur right upper sternal border  Lungs: mild wheezing bilaterally, rhonchi or rales Normal respiratory effort Abd: soft, nontender, no hepatomegaly  Ext: Pretibial edema, mild erythema bilaterally 1+  Skin: warm and dry , some shine noted to pretibial region. GU: Deferred Neuro: no focal abnormalities noted  EKG:  EKG ordered 12/21/15-HR fibrillation/flutter 92 bpm, old septal infarct-like pattern. Personally viewed. Prior- Atrial flutter heart rate of 60, 4-1 conduction, left axis deviation, poor R wave progression.   Lab work-recent creatinine stable, Dr. Felipa Eth.  ASSESSMENT AND PLAN:  83 year old with chronic atrial fibrillation/flutter, chronic anticoagulation, chronic venous insufficiency/lower extremity edema, chronic kidney disease stage III.  1. Atrial flutter/fibrillation- Low-dose diltiazem. He is very well rate controlled. No high risk symptoms such as syncope. Doing well. 4:1 conduction. No changes. 2. Chronic anticoagulation-doing well on low-dose Eliquis. No bleeding. In fact, had Mohs surgery previously on left face without difficulty. Did not have to stop Eliquis. No serious falls. Uses walker. Stable. 3. Chronic diastolic heart failure-actually, BNP was 92, normal. I will continue with Lasix 40 mg BID. Continue with daily weights. Creatinine is reassuring previously. Monitor salt,  fluids. I explained to them that overdiuresis may result in more difficulties such as dizziness, hypotension, worsening renal function. No significant changes. 4. COPD mild case. Overall been doing quite well especially after taking Levaquin. Cough has subsided. 5. Chronic venous insufficiency/edema-we gave him a prescription for compression hose previously. Has not needed to use these. Challenging to put on. Mild erythema noted on extremities  bilaterally. 6. 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   Prior medical records reviewed, we will see him back in 6 months.  Signed, Candee Furbish, MD [facility]  01/01/2017 3:31 PM

## 2017-01-02 ENCOUNTER — Encounter: Payer: Self-pay | Admitting: Physical Therapy

## 2017-01-02 ENCOUNTER — Ambulatory Visit: Payer: Medicare HMO | Admitting: Physical Therapy

## 2017-01-02 DIAGNOSIS — R296 Repeated falls: Secondary | ICD-10-CM | POA: Diagnosis not present

## 2017-01-02 DIAGNOSIS — R262 Difficulty in walking, not elsewhere classified: Secondary | ICD-10-CM

## 2017-01-02 DIAGNOSIS — M6281 Muscle weakness (generalized): Secondary | ICD-10-CM

## 2017-01-02 NOTE — Therapy (Signed)
[place], [place], [address] Phone: [phone]   Fax:  [phone]  Physical Therapy Treatment  Patient Details  Name: VARIAN INNES MRN: [id number] Date of Birth: November 10, 1917 Referring Provider: Lajean Manes  Encounter Date: 01/02/2017      PT End of Session - 01/02/17 1344    Visit Number 8   Date for PT Re-Evaluation 01/14/17   PT Start Time 5053   PT Stop Time 1345   PT Time Calculation (min) 40 min   Activity Tolerance Patient tolerated treatment well   Behavior During Therapy [facility] for tasks assessed/performed      Past Medical History:  Diagnosis Date  . Abnormal mini-mental status exam February 2014   24/30  . Atrial fibrillation ([place])   . CHF (congestive heart failure) ([place])   . COPD (chronic obstructive pulmonary disease) ([place])   . H/O TB (tuberculosis)    as a teenager  . Presbycusis     Past Surgical History:  Procedure Laterality Date  . CT [facility] CHOLECYSTOSTOMY  February 2014  . TRANSURETHRAL RESECTION OF PROSTATE  November 2013    There were no vitals filed for this visit.      Subjective Assessment - 01/02/17 1305    Subjective Reports "worn out" just getting here.   Currently in Pain? No/denies                         [facility] Adult PT Treatment/Exercise - 01/02/17 0001      Knee/Hip Exercises: Aerobic   Nustep L 4 x 7 min did not use arms today due to shoulder pain     Knee/Hip Exercises: Machines for Strengthening   Cybex Knee Extension 5# 3x10   Cybex Knee Flexion 25# 3x10   Other Machine seated red tband scapular stabilization 2 ways     Knee/Hip Exercises: Standing   Hip Flexion Both;20 reps     Knee/Hip Exercises: Seated   Other Seated Knee/Hip Exercises red tband ankle PF/DF 3x10 each, yellow tband shoulder ER, toe taps, heel raises in sitting, red tband scapular stabilizaiton, yellow tband ER this causes left shoulder pain, trunk  rotation with red tband   Marching 3 sets;15 reps   Abduction/Adduction  2 sets;10 reps   Abd/Adduction Limitations up on and off 6" step   Abd/Adduction Weights 3 lbs.                  PT Short Term Goals - 12/19/16 1342      PT SHORT TERM GOAL #1   Title have him try the NuStep where he lives at least 2x/week   Status On-going           PT Long Term Goals - 01/02/17 1346      PT LONG TERM GOAL #3   Title increase LE strength to 4/5   Status Partially Met     PT LONG TERM GOAL #4   Title walk 300 feet without rest   Status Partially Met               Plan - 01/02/17 1344    Clinical Impression Statement Patient is moving better overall, his daughter reports that he is faster and does not require rest as much, he has not gone to his gym on his own, they report that it is still difficult to walk to dinner   Clinical Presentation Evolving  Clinical Decision Making Moderate   PT Next Visit Plan add exercises as tolerated, I encouraged him to go to his gym where he lives and try the NuStep   Consulted and Agree with Plan of Care Patient      Patient will benefit from skilled therapeutic intervention in order to improve the following deficits and impairments:  Abnormal gait, Cardiopulmonary status limiting activity, Decreased activity tolerance, Decreased balance, Decreased mobility, Decreased endurance, Decreased range of motion, Decreased strength, Difficulty walking, Postural dysfunction, Improper body mechanics  Visit Diagnosis: Difficulty in walking, not elsewhere classified  Muscle weakness (generalized)  Repeated falls     Problem List Patient Active Problem List   Diagnosis Date Noted  . Pleural effusion, left 06/15/2013  . Atrial flutter ([place]) 05/06/2013  . Chronic airway obstruction, not elsewhere classified 04/21/2013  . CHF (congestive heart failure) ([place]) 04/21/2013  . Atrial fibrillation ([place]) 04/21/2013  . Encounter for long-term  (current) use of other medications 04/21/2013  . Edema 04/21/2013  . Memory loss 04/21/2013    Sumner Boast., PT 01/02/2017, 1:46 PM  [address] [place], [place], [address] Phone: [phone]   Fax:  [phone]  Name: MADDYX WIECK MRN: [id number] Date of Birth: 01-03-1918

## 2017-01-07 DIAGNOSIS — I872 Venous insufficiency (chronic) (peripheral): Secondary | ICD-10-CM | POA: Diagnosis not present

## 2017-01-09 ENCOUNTER — Encounter: Payer: Self-pay | Admitting: Physical Therapy

## 2017-01-09 ENCOUNTER — Ambulatory Visit: Payer: Medicare HMO | Attending: Geriatric Medicine | Admitting: Physical Therapy

## 2017-01-09 DIAGNOSIS — R296 Repeated falls: Secondary | ICD-10-CM | POA: Diagnosis not present

## 2017-01-09 DIAGNOSIS — M6281 Muscle weakness (generalized): Secondary | ICD-10-CM

## 2017-01-09 DIAGNOSIS — R262 Difficulty in walking, not elsewhere classified: Secondary | ICD-10-CM

## 2017-01-09 NOTE — Therapy (Signed)
[place], [place], [address] Phone: [phone]   Fax:  [phone]  Physical Therapy Treatment  Patient Details  Name: John Cherry MRN: [id number] Date of Birth: May 28, 1918 Referring Provider: Lajean Manes  Encounter Date: 01/09/2017      PT End of Session - 01/09/17 1343    Visit Number 9   Date for PT Re-Evaluation 01/14/17   PT Start Time 1253   PT Stop Time 1340   PT Time Calculation (min) 47 min   Activity Tolerance Patient tolerated treatment well   Behavior During Therapy [employer] for tasks assessed/performed      Past Medical History:  Diagnosis Date  . Abnormal mini-mental status exam February 2014   24/30  . Atrial fibrillation ([place])   . CHF (congestive heart failure) ([place])   . COPD (chronic obstructive pulmonary disease) ([place])   . H/O TB (tuberculosis)    as a teenager  . Presbycusis     Past Surgical History:  Procedure Laterality Date  . CT [facility] CHOLECYSTOSTOMY  February 2014  . TRANSURETHRAL RESECTION OF PROSTATE  November 2013    There were no vitals filed for this visit.      Subjective Assessment - 01/09/17 1256    Subjective Just "always tired"            [facility] PT Assessment - 01/09/17 0001      Timed Up and Go Test   Normal TUG (seconds) 42                     [facility] Adult PT Treatment/Exercise - 01/09/17 0001      Knee/Hip Exercises: Aerobic   Nustep L 4 x 8 min     Knee/Hip Exercises: Machines for Strengthening   Cybex Knee Extension 5# 3x10   Cybex Knee Flexion 25# 3x10   Other Machine seated red tband scapular stabilization 2 ways     Knee/Hip Exercises: Standing   Heel Raises Both;2 sets;10 reps   Hip Flexion Both;20 reps   Hip Flexion Limitations 2#   Hip ADduction 2 sets;10 reps   Hip ADduction Limitations 2#   Hip Abduction Both;2 sets;10 reps   Abduction Limitations 2#   Hip Extension Both;2 sets;10 reps   Other  Standing Knee Exercises Standing ball tosses 20 reps. Against wall shoulder ext rtband 2 x 10, scap retraction hands low 2 x 15     Knee/Hip Exercises: Seated   Other Seated Knee/Hip Exercises red tband ankle PF/DF 3x10 each, yellow tband shoulder ER, toe taps, heel raises in sitting, red tband scapular stabilizaiton, yellow tband ER this causes left shoulder pain, trunk rotation with red tband   Marching 3 sets;15 reps                  PT Short Term Goals - 12/19/16 1342      PT SHORT TERM GOAL #1   Title have him try the NuStep where he lives at least 2x/week   Status On-going           PT Long Term Goals - 01/09/17 1345      PT LONG TERM GOAL #1   Title decrease TUG time to 30 seconds   Status Partially Met               Plan - 01/09/17 1344    Clinical Impression Statement Patient and daughter report that he is getting up  from dining easier.  His TUG time is better.   PT Next Visit Plan write renewal next visit   Consulted and Agree with Plan of Care Patient      Patient will benefit from skilled therapeutic intervention in order to improve the following deficits and impairments:  Abnormal gait, Cardiopulmonary status limiting activity, Decreased activity tolerance, Decreased balance, Decreased mobility, Decreased endurance, Decreased range of motion, Decreased strength, Difficulty walking, Postural dysfunction, Improper body mechanics  Visit Diagnosis: Difficulty in walking, not elsewhere classified  Muscle weakness (generalized)  Repeated falls     Problem List Patient Active Problem List   Diagnosis Date Noted  . Pleural effusion, left 06/15/2013  . Atrial flutter ([place]) 05/06/2013  . Chronic airway obstruction, not elsewhere classified 04/21/2013  . CHF (congestive heart failure) ([place]) 04/21/2013  . Atrial fibrillation ([place]) 04/21/2013  . Encounter for long-term (current) use of other medications 04/21/2013  . Edema 04/21/2013  . Memory  loss 04/21/2013    Sumner Boast., PT 01/09/2017, 1:45 PM  [place] [address] [place], [place], [address] Phone: [phone]   Fax:  [phone]  Name: John Cherry MRN: [id number] Date of Birth: 02-12-1918

## 2017-01-16 ENCOUNTER — Ambulatory Visit: Payer: Medicare HMO | Admitting: Physical Therapy

## 2017-01-23 ENCOUNTER — Encounter: Payer: Self-pay | Admitting: Physical Therapy

## 2017-01-23 ENCOUNTER — Ambulatory Visit: Payer: Medicare HMO | Admitting: Physical Therapy

## 2017-01-23 DIAGNOSIS — R262 Difficulty in walking, not elsewhere classified: Secondary | ICD-10-CM | POA: Diagnosis not present

## 2017-01-23 DIAGNOSIS — M6281 Muscle weakness (generalized): Secondary | ICD-10-CM

## 2017-01-23 DIAGNOSIS — R296 Repeated falls: Secondary | ICD-10-CM | POA: Diagnosis not present

## 2017-01-23 NOTE — Therapy (Signed)
[place], [place], [address] Phone: [phone]   Fax:  [phone]  Physical Therapy Treatment  Patient Details  Name: John Cherry MRN: [id number] Date of Birth: 14-Dec-1917 Referring Provider: Lajean Manes  Encounter Date: 01/23/2017      PT End of Session - 01/23/17 1142    Visit Number 10   Date for PT Re-Evaluation 02/14/17   PT Start Time 3546   PT Stop Time 1139   PT Time Calculation (min) 48 min   Activity Tolerance Patient tolerated treatment well   Behavior During Therapy [facility] for tasks assessed/performed      Past Medical History:  Diagnosis Date  . Abnormal mini-mental status exam February 2014   24/30  . Atrial fibrillation ([place])   . CHF (congestive heart failure) ([place])   . COPD (chronic obstructive pulmonary disease) ([place])   . H/O TB (tuberculosis)    as a teenager  . Presbycusis     Past Surgical History:  Procedure Laterality Date  . CT [facility] CHOLECYSTOSTOMY  February 2014  . TRANSURETHRAL RESECTION OF PROSTATE  November 2013    There were no vitals filed for this visit.      Subjective Assessment - 01/23/17 1055    Subjective Patient was unable to attend last week due to incontinence issues.  Reports that stuff "wears me out"   Currently in Pain? No/denies                         [facility] Adult PT Treatment/Exercise - 01/23/17 0001      Transfers   Comments some cues to rach back for the chair     Ambulation/Gait   Gait Comments with HHA, cues for looking up, some tendency to lean and go to the right     Knee/Hip Exercises: Aerobic   Nustep L 4 x 8 min     Knee/Hip Exercises: Machines for Strengthening   Other Machine seated red tband scapular stabilization 2 ways     Knee/Hip Exercises: Standing   Heel Raises Both;2 sets;10 reps   Hip Flexion Both;20 reps   Hip Flexion Limitations 2#   Hip ADduction 2 sets;10 reps   Hip ADduction  Limitations 2#   Hip Abduction Both;2 sets;10 reps   Abduction Limitations 2#   Hip Extension Both;2 sets;10 reps   Other Standing Knee Exercises Standing ball tosses 20 reps. Against wall shoulder ext rtband 2 x 10, scap retraction hands low 2 x 15, wihtout any touching with a hand or the bakc of the knees to steady him he tended to lose balance to the back     Knee/Hip Exercises: Seated   Other Seated Knee/Hip Exercises red tband ankle PF/DF 3x10 each, yellow tband shoulder ER, toe taps, heel raises in sitting, red tband scapular stabilizaiton, yellow tband ER this causes left shoulder pain, trunk rotation with red tband                  PT Short Term Goals - 12/19/16 1342      PT SHORT TERM GOAL #1   Title have him try the NuStep where he lives at least 2x/week   Status On-going           PT Long Term Goals - 01/23/17 1146      PT LONG TERM GOAL #1   Title decrease TUG time to 30 seconds   Status  Partially Met     PT LONG TERM GOAL #2   Title increase Berg balance score to 20/56   Status On-going     PT LONG TERM GOAL #3   Title increase LE strength to 4/5   Status Partially Met     PT LONG TERM GOAL #4   Title walk 300 feet without rest   Status Partially Met               Plan - 30-Jan-2017 1143    Clinical Impression Statement Patient did well with the HHA walking, this seemed to help the step length, the speed and the posture.  There were also times today that he could get up on the first try.   PT Next Visit Plan Continue to work on function   Consulted and Agree with Plan of Care Patient      Patient will benefit from skilled therapeutic intervention in order to improve the following deficits and impairments:  Abnormal gait, Cardiopulmonary status limiting activity, Decreased activity tolerance, Decreased balance, Decreased mobility, Decreased endurance, Decreased range of motion, Decreased strength, Difficulty walking, Postural dysfunction,  Improper body mechanics  Visit Diagnosis: Difficulty in walking, not elsewhere classified - Plan: PT plan of care cert/re-cert  Muscle weakness (generalized) - Plan: PT plan of care cert/re-cert  Repeated falls - Plan: PT plan of care cert/re-cert       G-Codes - 01/30/17 1147    Functional Assessment Tool Used (Outpatient Only) foto 55% limitation   Functional Limitation Mobility: Walking and moving around   Mobility: Walking and Moving Around Current Status ([id number]) At least 40 percent but less than 60 percent impaired, limited or restricted   Mobility: Walking and Moving Around Goal Status [phone]) At least 60 percent but less than 80 percent impaired, limited or restricted      Problem List Patient Active Problem List   Diagnosis Date Noted  . Pleural effusion, left 06/15/2013  . Atrial flutter ([place]) 05/06/2013  . Chronic airway obstruction, not elsewhere classified 04/21/2013  . CHF (congestive heart failure) ([place]) 04/21/2013  . Atrial fibrillation ([place]) 04/21/2013  . Encounter for long-term (current) use of other medications 04/21/2013  . Edema 04/21/2013  . Memory loss 04/21/2013    Sumner Boast., PT 2017/01/30, 11:49 AM  [place] [address] [place], [place], [address] Phone: [phone]   Fax:  [phone]  Name: John Cherry MRN: [id number] Date of Birth: 02-Mar-1918

## 2017-01-30 ENCOUNTER — Ambulatory Visit: Payer: Medicare HMO | Admitting: Physical Therapy

## 2017-01-30 ENCOUNTER — Encounter: Payer: Self-pay | Admitting: Physical Therapy

## 2017-01-30 DIAGNOSIS — M6281 Muscle weakness (generalized): Secondary | ICD-10-CM

## 2017-01-30 DIAGNOSIS — R262 Difficulty in walking, not elsewhere classified: Secondary | ICD-10-CM

## 2017-01-30 DIAGNOSIS — R296 Repeated falls: Secondary | ICD-10-CM | POA: Diagnosis not present

## 2017-01-30 NOTE — Therapy (Signed)
[place], [place], [address] Phone: [phone]   Fax:  [phone]  Physical Therapy Treatment  Patient Details  Name: John Cherry MRN: [id number] Date of Birth: Jan 16, 1918 Referring Provider: Lajean Manes  Encounter Date: 01/30/2017      PT End of Session - 01/30/17 1346    Visit Number 11   Date for PT Re-Evaluation 02/14/17   PT Start Time 1300   PT Stop Time 1346   PT Time Calculation (min) 46 min   Activity Tolerance Patient limited by fatigue   Behavior During Therapy [facility] for tasks assessed/performed      Past Medical History:  Diagnosis Date  . Abnormal mini-mental status exam February 2014   24/30  . Atrial fibrillation ([place])   . CHF (congestive heart failure) ([place])   . COPD (chronic obstructive pulmonary disease) ([place])   . H/O TB (tuberculosis)    as a teenager  . Presbycusis     Past Surgical History:  Procedure Laterality Date  . CT [facility] CHOLECYSTOSTOMY  February 2014  . TRANSURETHRAL RESECTION OF PROSTATE  November 2013    There were no vitals filed for this visit.      Subjective Assessment - 01/30/17 1304    Subjective Patient's daughter tells me that they went to urgent care today because the patient wasn't feeling well, she reports that they found him to have low blood sugar, reports that once he ate he felt "fine"   Currently in Pain? No/denies                         [facility] Adult PT Treatment/Exercise - 01/30/17 0001      Ambulation/Gait   Gait Comments with HHA, cues for looking up, some tendency to lean and go to the right 3x100 feet     High Level Balance   High Level Balance Comments standing ball toss with CGA/Min A to stand for balance     Knee/Hip Exercises: Aerobic   Nustep L 5 x 8 min     Knee/Hip Exercises: Machines for Strengthening   Cybex Knee Extension 5# 3x10   Cybex Knee Flexion 25# 3x10   Other Machine seated  red tband scapular stabilization 2 ways     Knee/Hip Exercises: Standing   Heel Raises Both;2 sets;10 reps   Hip Flexion Both;20 reps   Hip Flexion Limitations 2#     Knee/Hip Exercises: Seated   Other Seated Knee/Hip Exercises red tband ankle PF/DF 3x10 each, yellow tband shoulder ER, toe taps, heel raises in sitting, red tband scapular stabilizaiton, yellow tband ER this causes left shoulder pain, trunk rotation with red tband                  PT Short Term Goals - 12/19/16 1342      PT SHORT TERM GOAL #1   Title have him try the NuStep where he lives at least 2x/week   Status On-going           PT Long Term Goals - 01/23/17 1146      PT LONG TERM GOAL #1   Title decrease TUG time to 30 seconds   Status Partially Met     PT LONG TERM GOAL #2   Title increase Berg balance score to 20/56   Status On-going     PT LONG TERM GOAL #3   Title increase LE strength to  4/5   Status Partially Met     PT LONG TERM GOAL #4   Title walk 300 feet without rest   Status Partially Met               Plan - 01/30/17 1346    Clinical Impression Statement Patient with some increased SOB with any activity today.  He would recover with a short rest, again did well with the HHA walking but still tends to lean to the right as he walks, can hold better posture like this.   PT Next Visit Plan Continue to work on function, they may be on vacation next week, encouraged snacks b/n breakfast and lunch   Consulted and Agree with Plan of Care Patient      Patient will benefit from skilled therapeutic intervention in order to improve the following deficits and impairments:  Abnormal gait, Cardiopulmonary status limiting activity, Decreased activity tolerance, Decreased balance, Decreased mobility, Decreased endurance, Decreased range of motion, Decreased strength, Difficulty walking, Postural dysfunction, Improper body mechanics  Visit Diagnosis: Difficulty in walking, not  elsewhere classified  Muscle weakness (generalized)  Repeated falls     Problem List Patient Active Problem List   Diagnosis Date Noted  . Pleural effusion, left 06/15/2013  . Atrial flutter ([place]) 05/06/2013  . Chronic airway obstruction, not elsewhere classified 04/21/2013  . CHF (congestive heart failure) ([place]) 04/21/2013  . Atrial fibrillation ([place]) 04/21/2013  . Encounter for long-term (current) use of other medications 04/21/2013  . Edema 04/21/2013  . Memory loss 04/21/2013    Sumner Boast., PT 01/30/2017, 1:48 PM  [place] [address] [place], [place], [address] Phone: [phone]   Fax:  [phone]  Name: John Cherry MRN: [id number] Date of Birth: 12/15/1917

## 2017-02-13 ENCOUNTER — Ambulatory Visit: Payer: Medicare HMO | Attending: Geriatric Medicine | Admitting: Physical Therapy

## 2017-02-13 ENCOUNTER — Encounter: Payer: Self-pay | Admitting: Physical Therapy

## 2017-02-13 DIAGNOSIS — M6281 Muscle weakness (generalized): Secondary | ICD-10-CM | POA: Diagnosis not present

## 2017-02-13 DIAGNOSIS — R262 Difficulty in walking, not elsewhere classified: Secondary | ICD-10-CM | POA: Diagnosis not present

## 2017-02-13 DIAGNOSIS — R296 Repeated falls: Secondary | ICD-10-CM | POA: Diagnosis not present

## 2017-02-13 NOTE — Therapy (Signed)
[place], [place], [address] Phone: [phone]   Fax:  [phone]  Physical Therapy Treatment  Patient Details  Name: John Cherry MRN: [id number] Date of Birth: 10-15-1917 Referring Provider: Lajean Manes  Encounter Date: 02/13/2017      PT End of Session - 02/13/17 1359    Visit Number 12   Date for PT Re-Evaluation 02/14/17   PT Start Time 1300   PT Stop Time 1355   PT Time Calculation (min) 55 min   Activity Tolerance Patient tolerated treatment well;Patient limited by fatigue   Behavior During Therapy [facility] for tasks assessed/performed      Past Medical History:  Diagnosis Date  . Abnormal mini-mental status exam February 2014   24/30  . Atrial fibrillation ([place])   . CHF (congestive heart failure) ([place])   . COPD (chronic obstructive pulmonary disease) ([place])   . H/O TB (tuberculosis)    as a teenager  . Presbycusis     Past Surgical History:  Procedure Laterality Date  . CT [facility] CHOLECYSTOSTOMY  February 2014  . TRANSURETHRAL RESECTION OF PROSTATE  November 2013    There were no vitals filed for this visit.      Subjective Assessment - 02/13/17 1303    Subjective Pt reports that he is feeling tired today. Said he was worn out after walking in and getting on the Nustep. Reports some pain in his left shoulder but otherwise feels okay.    Patient Stated Goals walk better.  be stronger   Currently in Pain? No/denies            [facility] PT Assessment - 02/13/17 0001      Timed Up and Go Test   TUG Normal TUG   Normal TUG (seconds) 43  with rolling walker                     [facility] Adult PT Treatment/Exercise - 02/13/17 0001      Transfers   Comments Can complete sit to stand transfer in one attempt with chair height of 22.5 in. Required multiple attempts in 19 in. chair.      Ambulation/Gait   Gait Comments HHA and cues for step length and postural  corrections, 80 feet in gym and 180 out to car.     High Level Balance   High Level Balance Comments standing ball toss with CGA/Min A to stand for balance     Knee/Hip Exercises: Aerobic   Nustep L 4 x 8 min     Knee/Hip Exercises: Standing   Heel Raises Both;2 sets;10 reps   Hip Flexion Both;10 reps   Other Standing Knee Exercises "Milkmaid" lifting chest, shoulder ext. with green Theraband around neck     Knee/Hip Exercises: Seated   Long Arc Quad Both;2 sets;10 reps   Long Arc Quad Weight --  5# ankle weight   Marching Both;2 sets;10 reps   Marching Weights --  5# ankle weights                  PT Short Term Goals - 02/13/17 1403      PT SHORT TERM GOAL #1   Title have him try the NuStep where he lives at least 2x/week   Time 8   Period Weeks   Status On-going           PT Long Term Goals - 02/13/17 1404  PT LONG TERM GOAL #1   Title decrease TUG time to 30 seconds   Time 8   Period Weeks   Status Partially Met     PT LONG TERM GOAL #4   Title walk 300 feet without rest   Time 8   Period Weeks   Status Partially Met               Plan - 02/13/17 1401    Clinical Impression Statement Pt did well with walking today. Requires HHA and cueing for step length and postural corrections. PT spoke with Pt's daughter regarding getting a higher chair or lift chair for him.  Pt requred multiple attempts to stand in 19 in. chair but was able to stand in one attempt with 22.5 in. chair.    PT Treatment/Interventions ADLs/Self Care Home Management;Gait training;Stair training;Functional mobility training;Patient/family education;Neuromuscular re-education;Balance training;Therapeutic exercise;Therapeutic activities;Manual techniques   PT Next Visit Plan Continue to work on strength, functional endurance, and posture.    Consulted and Agree with Plan of Care Patient      Patient will benefit from skilled therapeutic intervention in order to improve  the following deficits and impairments:     Visit Diagnosis: Difficulty in walking, not elsewhere classified  Muscle weakness (generalized)  Repeated falls     Problem List Patient Active Problem List   Diagnosis Date Noted  . Pleural effusion, left 06/15/2013  . Atrial flutter ([place]) 05/06/2013  . Chronic airway obstruction, not elsewhere classified 04/21/2013  . CHF (congestive heart failure) ([place]) 04/21/2013  . Atrial fibrillation ([place]) 04/21/2013  . Encounter for long-term (current) use of other medications 04/21/2013  . Edema 04/21/2013  . Memory loss 04/21/2013    Lennart Pall, SPT 02/13/2017, 2:13 PM  [place] [address] [place], [place], [address] Phone: [phone]   Fax:  [phone]  Name: KODEE RAVERT MRN: [id number] Date of Birth: 03-25-1918

## 2017-02-20 ENCOUNTER — Encounter: Payer: Self-pay | Admitting: Physical Therapy

## 2017-02-20 ENCOUNTER — Ambulatory Visit: Payer: Medicare HMO | Admitting: Physical Therapy

## 2017-02-20 DIAGNOSIS — M6281 Muscle weakness (generalized): Secondary | ICD-10-CM

## 2017-02-20 DIAGNOSIS — R262 Difficulty in walking, not elsewhere classified: Secondary | ICD-10-CM

## 2017-02-20 DIAGNOSIS — R296 Repeated falls: Secondary | ICD-10-CM

## 2017-02-20 NOTE — Therapy (Signed)
[place], [place], [address] Phone: [phone]   Fax:  [phone]  Physical Therapy Treatment  Patient Details  Name: DHANVIN SZETO MRN: [id number] Date of Birth: [birth year] Referring Provider: Lajean Manes  Encounter Date: 02/20/2017      PT End of Session - 02/20/17 1538    Visit Number 13   Date for PT Re-Evaluation 03/17/17   PT Start Time 1304   PT Stop Time 1348   PT Time Calculation (min) 44 min   Activity Tolerance Patient tolerated treatment well;Patient limited by fatigue   Behavior During Therapy [facility] for tasks assessed/performed      Past Medical History:  Diagnosis Date  . Abnormal mini-mental status exam February 2014   24/30  . Atrial fibrillation ([place])   . CHF (congestive heart failure) ([place])   . COPD (chronic obstructive pulmonary disease) ([place])   . H/O TB (tuberculosis)    as a teenager  . Presbycusis     Past Surgical History:  Procedure Laterality Date  . CT [facility] CHOLECYSTOSTOMY  February 2014  . TRANSURETHRAL RESECTION OF PROSTATE  November 2013    There were no vitals filed for this visit.      Subjective Assessment - 02/20/17 1309    Subjective Reports same, "just run down and worn out", no c/o pain or falls   Currently in Pain? No/denies                         [facility] Adult PT Treatment/Exercise - 02/20/17 0001      Ambulation/Gait   Gait Comments HHA and cues for step length and postural corrections, 180 feet from car to gym and 180' out to car.     High Level Balance   High Level Balance Comments standing ball toss with CGA/Min A to stand for balance     Knee/Hip Exercises: Aerobic   Nustep L 4 x 9 min     Knee/Hip Exercises: Machines for Strengthening   Other Machine seated red tband scapular stabilization 2 ways     Knee/Hip Exercises: Standing   Heel Raises Both;2 sets;10 reps   Hip Flexion Both;20 reps   Hip Flexion  Limitations 2#   Hip Abduction Both;2 sets;10 reps   Abduction Limitations 2#   Hip Extension Both;2 sets;10 reps   Extension Limitations 2#     Knee/Hip Exercises: Seated   Long Arc Quad Both;2 sets;10 reps   Long Arc Quad Weight 3 lbs.   Ball Squeeze 30   Marching Both;2 sets;10 reps   [employer] 2 lbs.   Sit to Sand 5 reps;with UE support                  PT Short Term Goals - 02/13/17 1403      PT SHORT TERM GOAL #1   Title have him try the NuStep where he lives at least 2x/week   Time 8   Period Weeks   Status On-going           PT Long Term Goals - 02/20/17 1550      PT LONG TERM GOAL #1   Title decrease TUG time to 30 seconds   Status Partially Met     PT LONG TERM GOAL #4   Title walk 300 feet without rest   Status Partially Met  Plan - 02/20/17 1539    Clinical Impression Statement Patient seemed to do much better with the HHA gait with better step length, less cues for posture and no rest.     PT Next Visit Plan Continue to work on strength, functional endurance, and posture.    Consulted and Agree with Plan of Care Patient      Patient will benefit from skilled therapeutic intervention in order to improve the following deficits and impairments:  Abnormal gait, Cardiopulmonary status limiting activity, Decreased activity tolerance, Decreased balance, Decreased mobility, Decreased endurance, Decreased range of motion, Decreased strength, Difficulty walking, Postural dysfunction, Improper body mechanics  Visit Diagnosis: Difficulty in walking, not elsewhere classified  Muscle weakness (generalized)  Repeated falls     Problem List Patient Active Problem List   Diagnosis Date Noted  . Pleural effusion, left 06/15/2013  . Atrial flutter ([place]) 05/06/2013  . Chronic airway obstruction, not elsewhere classified 04/21/2013  . CHF (congestive heart failure) ([place]) 04/21/2013  . Atrial fibrillation ([place]) 04/21/2013   . Encounter for long-term (current) use of other medications 04/21/2013  . Edema 04/21/2013  . Memory loss 04/21/2013    Sumner Boast., PT 02/20/2017, 3:50 PM  [place] [address] [place], [place], [address] Phone: [phone]   Fax:  [phone]  Name: JIDENNA FIGGS MRN: [id number] Date of Birth: [birth year]

## 2017-02-27 ENCOUNTER — Ambulatory Visit: Payer: Medicare HMO | Admitting: Physical Therapy

## 2017-02-27 ENCOUNTER — Encounter: Payer: Self-pay | Admitting: Physical Therapy

## 2017-02-27 DIAGNOSIS — R262 Difficulty in walking, not elsewhere classified: Secondary | ICD-10-CM

## 2017-02-27 DIAGNOSIS — R296 Repeated falls: Secondary | ICD-10-CM | POA: Diagnosis not present

## 2017-02-27 DIAGNOSIS — M6281 Muscle weakness (generalized): Secondary | ICD-10-CM | POA: Diagnosis not present

## 2017-02-27 NOTE — Therapy (Signed)
[place] [address] [place], [place], [address] Phone: [phone]   Fax:  [phone]  Physical Therapy Treatment  Patient Details  Name: John Cherry MRN: [id number] Date of Birth: 1917-12-24 Referring Provider: Lajean Manes  Encounter Date: 02/27/2017      PT End of Session - 02/27/17 1349    Visit Number 14   Date for PT Re-Evaluation 03/17/17   PT Start Time 1300   PT Stop Time 1349   PT Time Calculation (min) 49 min   Activity Tolerance Patient tolerated treatment well   Behavior During Therapy [facility] for tasks assessed/performed      Past Medical History:  Diagnosis Date  . Abnormal mini-mental status exam February 2014   24/30  . Atrial fibrillation ([place])   . CHF (congestive heart failure) ([place])   . COPD (chronic obstructive pulmonary disease) ([place])   . H/O TB (tuberculosis)    as a teenager  . Presbycusis     Past Surgical History:  Procedure Laterality Date  . CT [facility] CHOLECYSTOSTOMY  February 2014  . TRANSURETHRAL RESECTION OF PROSTATE  November 2013    There were no vitals filed for this visit.      Subjective Assessment - 02/27/17 1319    Subjective Reports that he has had some incresaed fluid on his LE's and a cough, he took more lasix last night.  Still appears to have increased lower leg edema   Currently in Pain? No/denies                         [facility] Adult PT Treatment/Exercise - 02/27/17 0001      Ambulation/Gait   Gait Comments HHA and cues for step length and postural corrections, 180 feet from car to gym and 180' out to car., gait in the clinic with HHA     Knee/Hip Exercises: Aerobic   Nustep L 4 x 10 min     Knee/Hip Exercises: Machines for Strengthening   Other Machine seated green tband scapular stabilization 2 ways, then green tband HS curls     Knee/Hip Exercises: Seated   Long Arc Quad Both;2 sets;10 reps   Long Arc Quad Weight 3 lbs.   Ball Squeeze 30   Other Seated Knee/Hip Exercises red tband ankle PF/DF 3x10 each, yellow tband shoulder ER, toe taps, heel raises in sitting, trunk rotation with red tband   Marching Both;2 sets;10 reps   Marching Weights 2 lbs.   Sit to Sand 15 reps;without UE support  tried 24" and 23" height, had some difficulty with the 23"      Knee/Hip Exercises: Supine   Other Supine Knee/Hip Exercises Seated forward and lateral toe taps on 6 in step 2 x 10                   PT Short Term Goals - 02/13/17 1403      PT SHORT TERM GOAL #1   Title have him try the NuStep where he lives at least 2x/week   Time 8   Period Weeks   Status On-going           PT Long Term Goals - 02/27/17 1350      PT LONG TERM GOAL #1   Title decrease TUG time to 30 seconds   Status Partially Met               Plan - 02/27/17  1349    Clinical Impression Statement Did very well walking out with me today, big steps, faster and less cues to look up.     PT Next Visit Plan Continue to work on strength, functional endurance, and posture.    Consulted and Agree with Plan of Care Patient      Patient will benefit from skilled therapeutic intervention in order to improve the following deficits and impairments:  Abnormal gait, Cardiopulmonary status limiting activity, Decreased activity tolerance, Decreased balance, Decreased mobility, Decreased endurance, Decreased range of motion, Decreased strength, Difficulty walking, Postural dysfunction, Improper body mechanics  Visit Diagnosis: Difficulty in walking, not elsewhere classified  Muscle weakness (generalized)  Repeated falls     Problem List Patient Active Problem List   Diagnosis Date Noted  . Pleural effusion, left 06/15/2013  . Atrial flutter ([place]) 05/06/2013  . Chronic airway obstruction, not elsewhere classified 04/21/2013  . CHF (congestive heart failure) ([place]) 04/21/2013  . Atrial fibrillation ([place]) 04/21/2013  . Encounter  for long-term (current) use of other medications 04/21/2013  . Edema 04/21/2013  . Memory loss 04/21/2013    Sumner Boast., PT 02/27/2017, 1:51 PM  [place] [address] [place], [place], [address] Phone: [phone]   Fax:  [phone]  Name: John Cherry MRN: [id number] Date of Birth: 07-Dec-1917

## 2017-03-06 ENCOUNTER — Encounter: Payer: Self-pay | Admitting: Physical Therapy

## 2017-03-06 ENCOUNTER — Ambulatory Visit: Payer: Medicare HMO | Attending: Geriatric Medicine | Admitting: Physical Therapy

## 2017-03-06 DIAGNOSIS — R262 Difficulty in walking, not elsewhere classified: Secondary | ICD-10-CM | POA: Diagnosis not present

## 2017-03-06 DIAGNOSIS — M6281 Muscle weakness (generalized): Secondary | ICD-10-CM | POA: Insufficient documentation

## 2017-03-06 DIAGNOSIS — R296 Repeated falls: Secondary | ICD-10-CM | POA: Diagnosis not present

## 2017-03-06 NOTE — Therapy (Signed)
[place], [place], [address] Phone: [phone]   Fax:  [phone]  Physical Therapy Treatment  Patient Details  Name: John Cherry MRN: [id number] Date of Birth: May 19, 1918 Referring Provider: Lajean Manes  Encounter Date: 03/06/2017      PT End of Session - 03/06/17 1347    Visit Number 15   Date for PT Re-Evaluation 03/17/17   PT Start Time 1300   PT Stop Time 1349   PT Time Calculation (min) 49 min   Activity Tolerance Patient tolerated treatment well   Behavior During Therapy [facility] for tasks assessed/performed      Past Medical History:  Diagnosis Date  . Abnormal mini-mental status exam February 2014   24/30  . Atrial fibrillation ([place])   . CHF (congestive heart failure) ([place])   . COPD (chronic obstructive pulmonary disease) ([place])   . H/O TB (tuberculosis)    as a teenager  . Presbycusis     Past Surgical History:  Procedure Laterality Date  . CT [facility] CHOLECYSTOSTOMY  February 2014  . TRANSURETHRAL RESECTION OF PROSTATE  November 2013    There were no vitals filed for this visit.      Subjective Assessment - 03/06/17 1307    Subjective Patient continues to have a cough, he had a small stumble catching his toe while walking in today.  He got a new lift chair at home, reports he is struggling to figure out how to use it.   Currently in Pain? No/denies                         [facility] Adult PT Treatment/Exercise - 03/06/17 0001      Ambulation/Gait   Gait Comments HHA and cues for step length and postural corrections, 180 feet from car to gym and 180' out to car., gait in the clinic with HHA     Knee/Hip Exercises: Aerobic   Nustep L 4 x 10 min     Knee/Hip Exercises: Machines for Strengthening   Cybex Knee Extension 5# 3x10   Cybex Knee Flexion 25# 3x10   Other Machine seated green tband scapular stabilization 2 ways,      Knee/Hip Exercises:  Standing   Hip Abduction Both;2 sets;10 reps   Abduction Limitations 2#   Hip Extension Both;2 sets;10 reps   Extension Limitations 2#     Knee/Hip Exercises: Seated   Ball Squeeze 30   Other Seated Knee/Hip Exercises red tband ankle PF/DF 3x10 each, yellow tband shoulder ER, toe taps, heel raises in sitting, trunk rotation with red tband   Marching Both;2 sets;10 reps   Marching Limitations 2#     Knee/Hip Exercises: Supine   Other Supine Knee/Hip Exercises Seated forward and lateral toe taps on 6 in step 2 x 10                   PT Short Term Goals - 02/13/17 1403      PT SHORT TERM GOAL #1   Title have him try the NuStep where he lives at least 2x/week   Time 8   Period Weeks   Status On-going           PT Long Term Goals - 02/27/17 1350      PT LONG TERM GOAL #1   Title decrease TUG time to 30 seconds   Status Partially Met  Plan - 03/06/17 1348    Clinical Impression Statement He had a lot of chest congestion today, he did well with exercises and did great with the ambulation, less cues to look up for posture, and for step length, good speed and no rest out to the car.  Some weakness in the left hip flexor.  We currently see him 1x/week due to the place he lives not accepting his insurance and his daughter has to pick him up and bring him, they have talked with the facility administrator about getting another NuStep and putting it closer to his apartment, currently it is a long walk for him to get there.   PT Next Visit Plan Continue to work on strength, functional endurance, and posture.    Consulted and Agree with Plan of Care Patient      Patient will benefit from skilled therapeutic intervention in order to improve the following deficits and impairments:  Abnormal gait, Cardiopulmonary status limiting activity, Decreased activity tolerance, Decreased balance, Decreased mobility, Decreased endurance, Decreased range of motion,  Decreased strength, Difficulty walking, Postural dysfunction, Improper body mechanics  Visit Diagnosis: Difficulty in walking, not elsewhere classified  Muscle weakness (generalized)  Repeated falls     Problem List Patient Active Problem List   Diagnosis Date Noted  . Pleural effusion, left 06/15/2013  . Atrial flutter ([place]) 05/06/2013  . Chronic airway obstruction, not elsewhere classified 04/21/2013  . CHF (congestive heart failure) ([place]) 04/21/2013  . Atrial fibrillation ([place]) 04/21/2013  . Encounter for long-term (current) use of other medications 04/21/2013  . Edema 04/21/2013  . Memory loss 04/21/2013    Sumner Boast., PT 03/06/2017, 1:51 PM  [place] [address] [place], [place], [address] Phone: [phone]   Fax:  [phone]  Name: John Cherry MRN: [id number] Date of Birth: [birth year]

## 2017-03-13 ENCOUNTER — Ambulatory Visit: Payer: Medicare HMO | Admitting: Physical Therapy

## 2017-03-13 ENCOUNTER — Encounter: Payer: Self-pay | Admitting: Physical Therapy

## 2017-03-13 DIAGNOSIS — R296 Repeated falls: Secondary | ICD-10-CM | POA: Diagnosis not present

## 2017-03-13 DIAGNOSIS — M6281 Muscle weakness (generalized): Secondary | ICD-10-CM

## 2017-03-13 DIAGNOSIS — R262 Difficulty in walking, not elsewhere classified: Secondary | ICD-10-CM | POA: Diagnosis not present

## 2017-03-13 NOTE — Therapy (Signed)
[place], [place], [address] Phone: [phone]   Fax:  [phone]  Physical Therapy Treatment  Patient Details  Name: John Cherry MRN: [id number] Date of Birth: 07-18-1918 Referring Provider: Lajean Manes  Encounter Date: 03/13/2017      PT End of Session - 03/13/17 1830    Visit Number 16   Date for PT Re-Evaluation 03/17/17   PT Start Time 1307   PT Stop Time 1357   PT Time Calculation (min) 50 min   Activity Tolerance Patient tolerated treatment well   Behavior During Therapy [facility] for tasks assessed/performed      Past Medical History:  Diagnosis Date  . Abnormal mini-mental status exam February 2014   24/30  . Atrial fibrillation ([place])   . CHF (congestive heart failure) ([place])   . COPD (chronic obstructive pulmonary disease) ([place])   . H/O TB (tuberculosis)    as a teenager  . Presbycusis     Past Surgical History:  Procedure Laterality Date  . CT [facility] CHOLECYSTOSTOMY  February 2014  . TRANSURETHRAL RESECTION OF PROSTATE  November 2013    There were no vitals filed for this visit.      Subjective Assessment - 03/13/17 1436    Subjective Patient's daughter reports that they have trying to get him to walk to the mailbox on a daily basis and he has been able to do it better than previuosly.   Currently in Pain? No/denies                         [facility] Adult PT Treatment/Exercise - 03/13/17 0001      Ambulation/Gait   Gait Comments HHA and cues for step length and postural corrections, 180 feet from car to gym and 180' out to car., gait in the clinic with HHA     High Level Balance   High Level Balance Activities Side stepping;Backward walking     Knee/Hip Exercises: Aerobic   Nustep L 4 x 10 min     Knee/Hip Exercises: Machines for Strengthening   Cybex Knee Extension 5# 3x10   Cybex Knee Flexion 25# 3x10   Other Machine seated green tband scapular  stabilization 2 ways,      Knee/Hip Exercises: Standing   Heel Raises Both;2 sets;10 reps   Hip Flexion Both;20 reps   Hip Flexion Limitations 2#   Hip Abduction Both;2 sets;10 reps   Abduction Limitations 2#     Knee/Hip Exercises: Supine   Other Supine Knee/Hip Exercises Seated forward and lateral toe taps on 6 in step 2 x 10                   PT Short Term Goals - 02/13/17 1403      PT SHORT TERM GOAL #1   Title have him try the NuStep where he lives at least 2x/week   Time 8   Period Weeks   Status On-going           PT Long Term Goals - 03/13/17 1832      PT LONG TERM GOAL #1   Title decrease TUG time to 30 seconds   Status Partially Met               Plan - 03/13/17 1830    Clinical Impression Statement I asked the daughter if she could ask a caregiver to walk with him and to  see if they could do some exercises with him when he is away from here.  She is only able to bring him 1x/week and he is needing some increased activity when he is not here   PT Next Visit Plan have the family and or care giver be instructed in exercises and gait   Consulted and Agree with Plan of Care Patient      Patient will benefit from skilled therapeutic intervention in order to improve the following deficits and impairments:  Abnormal gait, Cardiopulmonary status limiting activity, Decreased activity tolerance, Decreased balance, Decreased mobility, Decreased endurance, Decreased range of motion, Decreased strength, Difficulty walking, Postural dysfunction, Improper body mechanics  Visit Diagnosis: Difficulty in walking, not elsewhere classified  Muscle weakness (generalized)  Repeated falls     Problem List Patient Active Problem List   Diagnosis Date Noted  . Pleural effusion, left 06/15/2013  . Atrial flutter ([place]) 05/06/2013  . Chronic airway obstruction, not elsewhere classified 04/21/2013  . CHF (congestive heart failure) ([place]) 04/21/2013  . Atrial  fibrillation ([place]) 04/21/2013  . Encounter for long-term (current) use of other medications 04/21/2013  . Edema 04/21/2013  . Memory loss 04/21/2013    Sumner Boast., PT 03/13/2017, 6:32 PM  [address] [address] [place], [place], [address] Phone: [phone]   Fax:  [phone]  Name: John Cherry MRN: [id number] Date of Birth: [birth year]

## 2017-03-27 ENCOUNTER — Ambulatory Visit: Payer: Medicare HMO | Admitting: Physical Therapy

## 2017-03-27 ENCOUNTER — Encounter: Payer: Self-pay | Admitting: Physical Therapy

## 2017-03-27 DIAGNOSIS — R296 Repeated falls: Secondary | ICD-10-CM | POA: Diagnosis not present

## 2017-03-27 DIAGNOSIS — M6281 Muscle weakness (generalized): Secondary | ICD-10-CM | POA: Diagnosis not present

## 2017-03-27 DIAGNOSIS — R262 Difficulty in walking, not elsewhere classified: Secondary | ICD-10-CM

## 2017-03-27 NOTE — Therapy (Signed)
[place], [place], [address] Phone: [phone]   Fax:  [phone]  Physical Therapy Treatment  Patient Details  Name: John Cherry MRN: [id number] Date of Birth: 10/21/1917 Referring Provider: Lajean Manes  Encounter Date: 03/27/2017      PT End of Session - 03/27/17 1355    Visit Number 17   Date for PT Re-Evaluation 04/27/17   PT Start Time 1306   PT Stop Time 1350   PT Time Calculation (min) 44 min   Activity Tolerance Patient tolerated treatment well   Behavior During Therapy [facility] for tasks assessed/performed      Past Medical History:  Diagnosis Date  . Abnormal mini-mental status exam February 2014   24/30  . Atrial fibrillation ([place])   . CHF (congestive heart failure) ([place])   . COPD (chronic obstructive pulmonary disease) ([place])   . H/O TB (tuberculosis)    as a teenager  . Presbycusis     Past Surgical History:  Procedure Laterality Date  . CT [facility] CHOLECYSTOSTOMY  February 2014  . TRANSURETHRAL RESECTION OF PROSTATE  November 2013    There were no vitals filed for this visit.      Subjective Assessment - 03/27/17 1311    Subjective Reports that he still has not tried to make it to his gym.  He does report that he has been walking to the mailbox daily.   Currently in Pain? No/denies            [facility] PT Assessment - 03/27/17 0001      Timed Up and Go Test   Normal TUG (seconds) 32                     [facility] Adult PT Treatment/Exercise - 03/27/17 0001      High Level Balance   High Level Balance Activities Side stepping;Backward walking   High Level Balance Comments standing ball toss with CGA/Min A to stand for balance     Knee/Hip Exercises: Aerobic   Nustep L 5 x 10 min     Knee/Hip Exercises: Machines for Strengthening   Cybex Knee Extension 5# 3x10   Other Machine seated green tband scapular stabilization 2 ways,      Knee/Hip  Exercises: Standing   Heel Raises Both;2 sets;10 reps   Hip Flexion Both;20 reps   Hip Abduction Both;2 sets;10 reps   Abduction Limitations 2#     Knee/Hip Exercises: Seated   Ball Squeeze 30   Other Seated Knee/Hip Exercises red tband ankle PF/DF 3x10 each, yellow tband shoulder ER, toe taps, heel raises in sitting, trunk rotation with red tband     Knee/Hip Exercises: Supine   Other Supine Knee/Hip Exercises Seated forward and lateral toe taps on 6 in step 2 x 10                   PT Short Term Goals - 02/13/17 1403      PT SHORT TERM GOAL #1   Title have him try the NuStep where he lives at least 2x/week   Time 8   Period Weeks   Status On-going           PT Long Term Goals - 03/27/17 1437      PT LONG TERM GOAL #1   Title decrease TUG time to 30 seconds   Status Partially Met     PT LONG TERM GOAL #  2   Title increase Berg balance score to 20/56   Status Partially Met     PT LONG TERM GOAL #3   Title increase LE strength to 4/5   Status Partially Met     PT LONG TERM GOAL #4   Title walk 300 feet without rest   Status Partially Met               Plan - 03/27/17 1435    Clinical Impression Statement Patient has improved, had easier time getting up out of chair this allowed him to shave 11 seconds off of his TUG time.  He is walking on a daily basis for exercise, he has a CNA and I may want to give her HEP to work with Mr. Mauriello on.   PT Next Visit Plan possibly give HEP for CNA to work with him on.   Consulted and Agree with Plan of Care Patient      Patient will benefit from skilled therapeutic intervention in order to improve the following deficits and impairments:  Abnormal gait, Cardiopulmonary status limiting activity, Decreased activity tolerance, Decreased balance, Decreased mobility, Decreased endurance, Decreased range of motion, Decreased strength, Difficulty walking, Postural dysfunction, Improper body mechanics  Visit  Diagnosis: Difficulty in walking, not elsewhere classified - Plan: PT plan of care cert/re-cert  Muscle weakness (generalized) - Plan: PT plan of care cert/re-cert  Repeated falls - Plan: PT plan of care cert/re-cert     Problem List Patient Active Problem List   Diagnosis Date Noted  . Pleural effusion, left 06/15/2013  . Atrial flutter ([place]) 05/06/2013  . Chronic airway obstruction, not elsewhere classified 04/21/2013  . CHF (congestive heart failure) ([place]) 04/21/2013  . Atrial fibrillation ([place]) 04/21/2013  . Encounter for long-term (current) use of other medications 04/21/2013  . Edema 04/21/2013  . Memory loss 04/21/2013    Sumner Boast., PT 03/27/2017, 2:39 PM  [place] [address] [place], [place], [address] Phone: [phone]   Fax:  [phone]  Name: John Cherry MRN: [id number] Date of Birth: 1918-05-30

## 2017-04-03 ENCOUNTER — Encounter: Payer: Self-pay | Admitting: Physical Therapy

## 2017-04-03 ENCOUNTER — Ambulatory Visit: Payer: Medicare HMO | Admitting: Physical Therapy

## 2017-04-03 DIAGNOSIS — R296 Repeated falls: Secondary | ICD-10-CM | POA: Diagnosis not present

## 2017-04-03 DIAGNOSIS — M6281 Muscle weakness (generalized): Secondary | ICD-10-CM

## 2017-04-03 DIAGNOSIS — R262 Difficulty in walking, not elsewhere classified: Secondary | ICD-10-CM

## 2017-04-03 NOTE — Therapy (Signed)
[place], [place], [address] Phone: [phone]   Fax:  [phone]  Physical Therapy Treatment  Patient Details  Name: John Cherry MRN: [id number] Date of Birth: 1918-08-02 Referring Provider: Lajean Manes  Encounter Date: 04/03/2017      PT End of Session - 04/03/17 1352    Visit Number 18   Date for PT Re-Evaluation 04/27/17   PT Start Time 1304   PT Stop Time 1353   PT Time Calculation (min) 49 min   Activity Tolerance Patient tolerated treatment well   Behavior During Therapy [facility] for tasks assessed/performed      Past Medical History:  Diagnosis Date  . Abnormal mini-mental status exam February 2014   24/30  . Atrial fibrillation ([place])   . CHF (congestive heart failure) ([place])   . COPD (chronic obstructive pulmonary disease) ([place])   . H/O TB (tuberculosis)    as a teenager  . Presbycusis     Past Surgical History:  Procedure Laterality Date  . CT [facility] CHOLECYSTOSTOMY  February 2014  . TRANSURETHRAL RESECTION OF PROSTATE  November 2013    There were no vitals filed for this visit.      Subjective Assessment - 04/03/17 1307    Subjective Reports that he is feeling a little better, less cough and a little more energy   Currently in Pain? No/denies                         [facility] Adult PT Treatment/Exercise - 04/03/17 0001      Knee/Hip Exercises: Aerobic   Nustep L 5 x 10 min     Knee/Hip Exercises: Machines for Strengthening   Cybex Knee Extension 5# 3x10   Cybex Knee Flexion 25# 3x10   Other Machine seated green tband scapular stabilization 2 ways,      Knee/Hip Exercises: Standing   Heel Raises Both;2 sets;10 reps   Hip Flexion Both;20 reps   Hip Flexion Limitations 2#   Hip Abduction Both;2 sets;10 reps   Abduction Limitations 2#   Hip Extension Both;2 sets;10 reps   Extension Limitations 2#                PT Education -  04/03/17 1351    Education provided Yes   Education Details HEP for home with aide   Person(s) Educated Patient   Methods Explanation;Demonstration;Handout   Comprehension Verbalized understanding          PT Short Term Goals - 02/13/17 1403      PT SHORT TERM GOAL #1   Title have him try the NuStep where he lives at least 2x/week   Time 8   Period Weeks   Status On-going           PT Long Term Goals - 04/03/17 1357      PT LONG TERM GOAL #1   Title decrease TUG time to 30 seconds   Status Partially Met     PT LONG TERM GOAL #4   Title walk 300 feet without rest   Status Partially Met               Plan - 04/03/17 1354    Clinical Impression Statement I gave an HEP to patient and his son in law.  Went over with them, I asked if they could have his aide try them with Mr. Litsey about 3 x /week if  possible.  His left DF is very poor.,   PT Next Visit Plan See if they could have CNA do the HEP   Consulted and Agree with Plan of Care Patient      Patient will benefit from skilled therapeutic intervention in order to improve the following deficits and impairments:  Abnormal gait, Cardiopulmonary status limiting activity, Decreased activity tolerance, Decreased balance, Decreased mobility, Decreased endurance, Decreased range of motion, Decreased strength, Difficulty walking, Postural dysfunction, Improper body mechanics  Visit Diagnosis: Difficulty in walking, not elsewhere classified  Muscle weakness (generalized)  Repeated falls     Problem List Patient Active Problem List   Diagnosis Date Noted  . Pleural effusion, left 06/15/2013  . Atrial flutter ([place]) 05/06/2013  . Chronic airway obstruction, not elsewhere classified 04/21/2013  . CHF (congestive heart failure) ([place]) 04/21/2013  . Atrial fibrillation ([place]) 04/21/2013  . Encounter for long-term (current) use of other medications 04/21/2013  . Edema 04/21/2013  . Memory loss 04/21/2013     Sumner Boast., PT 04/03/2017, 1:58 PM  [address] [place], [place], [address] Phone: [phone]   Fax:  [phone]  Name: John Cherry MRN: [id number] Date of Birth: 1918-04-13

## 2017-04-10 ENCOUNTER — Ambulatory Visit: Payer: Medicare HMO | Attending: Geriatric Medicine | Admitting: Physical Therapy

## 2017-04-10 ENCOUNTER — Encounter: Payer: Self-pay | Admitting: Physical Therapy

## 2017-04-10 DIAGNOSIS — R296 Repeated falls: Secondary | ICD-10-CM

## 2017-04-10 DIAGNOSIS — R262 Difficulty in walking, not elsewhere classified: Secondary | ICD-10-CM | POA: Insufficient documentation

## 2017-04-10 DIAGNOSIS — M6281 Muscle weakness (generalized): Secondary | ICD-10-CM | POA: Diagnosis not present

## 2017-04-10 NOTE — Therapy (Signed)
[place], [place], [address] Phone: [phone]   Fax:  [phone]  Physical Therapy Treatment  Patient Details  Name: John Cherry MRN: [id number] Date of Birth: 1918-06-01 Referring Provider: Lajean Manes  Encounter Date: 04/10/2017      PT End of Session - 04/10/17 1324    Visit Number 19   Date for PT Re-Evaluation 04/27/17   PT Start Time 1253   PT Stop Time 1340   PT Time Calculation (min) 47 min   Activity Tolerance Patient tolerated treatment well   Behavior During Therapy [facility] for tasks assessed/performed      Past Medical History:  Diagnosis Date  . Abnormal mini-mental status exam February 2014   24/30  . Atrial fibrillation ([place])   . CHF (congestive heart failure) ([place])   . COPD (chronic obstructive pulmonary disease) ([place])   . H/O TB (tuberculosis)    as a teenager  . Presbycusis     Past Surgical History:  Procedure Laterality Date  . CT [facility] CHOLECYSTOSTOMY  February 2014  . TRANSURETHRAL RESECTION OF PROSTATE  November 2013    There were no vitals filed for this visit.      Subjective Assessment - 04/10/17 1257    Subjective No falls, been doing a little better.     Currently in Pain? No/denies            [facility] PT Assessment - 04/10/17 0001      Berg Balance Test   Sit to Stand Able to stand using hands after several tries   Standing Unsupported Able to stand 30 seconds unsupported   Sitting with Back Unsupported but Feet Supported on Floor or Stool Able to sit safely and securely 2 minutes   Stand to Sit Uses backs of legs against chair to control descent   Transfers Able to transfer with verbal cueing and /or supervision   Standing Unsupported with Eyes Closed Needs help to keep from falling   Standing Ubsupported with Feet Together Needs help to attain position but able to stand for 30 seconds with feet together   From Standing, Reach Forward  with Outstretched Arm Can reach forward >5 cm safely (2")   From Standing Position, Pick up Object from Floor Unable to try/needs assist to keep balance   From Standing Position, Turn to Look Behind Over each Shoulder Needs supervision when turning   Turn 360 Degrees Needs assistance while turning   Standing Unsupported, Alternately Place Feet on Step/Stool Needs assistance to keep from falling or unable to try   Standing Unsupported, One Foot in Front Loses balance while stepping or standing   Standing on One Leg Unable to try or needs assist to prevent fall   Total Score 16                     [facility] Adult PT Treatment/Exercise - 04/10/17 0001      Ambulation/Gait   Gait Comments HHA and cues for step length and postural corrections, 180'  from gym to car     Knee/Hip Exercises: Aerobic   Nustep L 5 x 10 min     Knee/Hip Exercises: Machines for Strengthening   Cybex Knee Extension 5# 3x10   Cybex Knee Flexion 35# 3x10   Other Machine seated green tband scapular stabilization 2 ways,      Knee/Hip Exercises: Standing   Heel Raises Both;2 sets;10 reps  Knee/Hip Exercises: Seated   Ball Squeeze 30   Other Seated Knee/Hip Exercises red tband ankle PF/DF 3x10 each, yellow tband shoulder ER, toe taps, heel raises in sitting, trunk rotation with red tband   Abduction/Adduction  2 sets;10 reps   Abd/Adduction Limitations up on and off 6" step   Abd/Adduction Weights 3 lbs.                  PT Short Term Goals - 02/13/17 1403      PT SHORT TERM GOAL #1   Title have him try the NuStep where he lives at least 2x/week   Time 8   Period Weeks   Status On-going           PT Long Term Goals - 04/10/17 1350      PT LONG TERM GOAL #2   Title increase Berg balance score to 20/56   Status Partially Met               Plan - 04/10/17 1325    Clinical Impression Statement Berg balance score doubled from eval, he is still at a very high risk for  falls   PT Next Visit Plan See if they could have CNA do the HEP, they did it one time in the past week   Consulted and Agree with Plan of Care Patient;Family member/caregiver   Family Member Boykin Nearing, daughter      Patient will benefit from skilled therapeutic intervention in order to improve the following deficits and impairments:  Abnormal gait, Cardiopulmonary status limiting activity, Decreased activity tolerance, Decreased balance, Decreased mobility, Decreased endurance, Decreased range of motion, Decreased strength, Difficulty walking, Postural dysfunction, Improper body mechanics  Visit Diagnosis: Difficulty in walking, not elsewhere classified  Muscle weakness (generalized)  Repeated falls     Problem List Patient Active Problem List   Diagnosis Date Noted  . Pleural effusion, left 06/15/2013  . Atrial flutter ([place]) 05/06/2013  . Chronic airway obstruction, not elsewhere classified 04/21/2013  . CHF (congestive heart failure) ([place]) 04/21/2013  . Atrial fibrillation ([place]) 04/21/2013  . Encounter for long-term (current) use of other medications 04/21/2013  . Edema 04/21/2013  . Memory loss 04/21/2013    Sumner Boast., PT 04/10/2017, 1:50 PM  [place] [address] [place], [place], [address] Phone: [phone]   Fax:  [phone]  Name: John Cherry MRN: [id number] Date of Birth: March 05, 1918

## 2017-04-16 DIAGNOSIS — I481 Persistent atrial fibrillation: Secondary | ICD-10-CM | POA: Diagnosis not present

## 2017-04-16 DIAGNOSIS — D72825 Bandemia: Secondary | ICD-10-CM | POA: Diagnosis not present

## 2017-04-16 DIAGNOSIS — J449 Chronic obstructive pulmonary disease, unspecified: Secondary | ICD-10-CM | POA: Diagnosis not present

## 2017-04-16 DIAGNOSIS — I5022 Chronic systolic (congestive) heart failure: Secondary | ICD-10-CM | POA: Diagnosis not present

## 2017-04-16 DIAGNOSIS — Z79899 Other long term (current) drug therapy: Secondary | ICD-10-CM | POA: Diagnosis not present

## 2017-04-16 DIAGNOSIS — R197 Diarrhea, unspecified: Secondary | ICD-10-CM | POA: Diagnosis not present

## 2017-04-17 ENCOUNTER — Ambulatory Visit: Payer: Medicare HMO | Admitting: Physical Therapy

## 2017-04-24 DIAGNOSIS — Z23 Encounter for immunization: Secondary | ICD-10-CM | POA: Diagnosis not present

## 2017-04-24 DIAGNOSIS — I481 Persistent atrial fibrillation: Secondary | ICD-10-CM | POA: Diagnosis not present

## 2017-04-24 DIAGNOSIS — K830 Cholangitis: Secondary | ICD-10-CM | POA: Diagnosis not present

## 2017-04-24 DIAGNOSIS — Z79899 Other long term (current) drug therapy: Secondary | ICD-10-CM | POA: Diagnosis not present

## 2017-04-24 DIAGNOSIS — I5022 Chronic systolic (congestive) heart failure: Secondary | ICD-10-CM | POA: Diagnosis not present

## 2017-05-01 ENCOUNTER — Ambulatory Visit: Payer: Medicare HMO | Admitting: Physical Therapy

## 2017-05-01 ENCOUNTER — Encounter: Payer: Self-pay | Admitting: Physical Therapy

## 2017-05-01 DIAGNOSIS — R296 Repeated falls: Secondary | ICD-10-CM

## 2017-05-01 DIAGNOSIS — R262 Difficulty in walking, not elsewhere classified: Secondary | ICD-10-CM | POA: Diagnosis not present

## 2017-05-01 DIAGNOSIS — M6281 Muscle weakness (generalized): Secondary | ICD-10-CM

## 2017-05-01 NOTE — Therapy (Signed)
[place], [place], [address] Phone: [phone]   Fax:  [phone]  Physical Therapy Treatment  Patient Details  Name: John Cherry MRN: [id number] Date of Birth: 12/06/1917 Referring Provider: Lajean Manes  Encounter Date: 05/01/2017      PT End of Session - 05/01/17 1341    Visit Number 20   Date for PT Re-Evaluation 05/28/17   PT Start Time 1304   PT Stop Time 1345   PT Time Calculation (min) 41 min   Activity Tolerance Patient tolerated treatment well   Behavior During Therapy [facility] for tasks assessed/performed      Past Medical History:  Diagnosis Date  . Abnormal mini-mental status exam February 2014   24/30  . Atrial fibrillation ([place])   . CHF (congestive heart failure) ([place])   . COPD (chronic obstructive pulmonary disease) ([place])   . H/O TB (tuberculosis)    as a teenager  . Presbycusis     Past Surgical History:  Procedure Laterality Date  . CT [facility] CHOLECYSTOSTOMY  February 2014  . TRANSURETHRAL RESECTION OF PROSTATE  November 2013    There were no vitals filed for this visit.      Subjective Assessment - 05/01/17 1308    Subjective I haven't done much, reports that he has had some gall bladder issues over the past week or two   Currently in Pain? No/denies            [facility] PT Assessment - 05/01/17 0001      Strength   Overall Strength Comments hips 4-/5     Ambulation/Gait   Gait Comments gait 200 feet prior to stopping     Timed Up and Go Test   Normal TUG (seconds) 35                     [facility] Adult PT Treatment/Exercise - 05/01/17 0001      Knee/Hip Exercises: Aerobic   Nustep L 5 x 10 min     Knee/Hip Exercises: Machines for Strengthening   Cybex Knee Extension 5# 3x10   Cybex Knee Flexion 25# 3x10   Other Machine seated green tband scapular stabilization 2 ways,      Knee/Hip Exercises: Standing   Heel Raises Both;2 sets;10  reps   Hip Flexion Both;20 reps     Knee/Hip Exercises: Seated   Ball Squeeze 30   Other Seated Knee/Hip Exercises red tband ankle PF/DF 3x10 each, yellow tband shoulder ER, toe taps, heel raises in sitting, trunk rotation with red tband   Abduction/Adduction  2 sets;10 reps   Abd/Adduction Limitations up on and off 6" step     Knee/Hip Exercises: Supine   Other Supine Knee/Hip Exercises Seated cone touches forward and across body 2 x 10                   PT Short Term Goals - 02/13/17 1403      PT SHORT TERM GOAL #1   Title have him try the NuStep where he lives at least 2x/week   Time 8   Period Weeks   Status On-going           PT Long Term Goals - 05/01/17 1344      PT LONG TERM GOAL #1   Title decrease TUG time to 30 seconds   Status Partially Met     PT LONG TERM GOAL #2  Title increase Berg balance score to 20/56   Status Partially Met     PT LONG TERM GOAL #3   Title increase LE strength to 4/5   Status Partially Met     PT LONG TERM GOAL #4   Title walk 300 feet without rest   Status Partially Met               Plan - May 08, 2017 1342    Clinical Impression Statement Patient has not been in in about 3 weeks, the hurrican, and his health have caused this, he relies on his daughter to bring him and she has a house at [employer], then he had some issues with gall bladder, he overall really stopped doing much the past 3 weeks as he has been feeling bad, he has increased edema in the legs today with some weeping around the distal shins   PT Frequency 1x / week   PT Duration 4 weeks   PT Treatment/Interventions ADLs/Self Care Home Management;Gait training;Stair training;Functional mobility training;Patient/family education;Neuromuscular re-education;Balance training;Therapeutic exercise;Therapeutic activities;Manual techniques   PT Next Visit Plan I continue to encourage him to walk at his place, encourage the CNA to helpw ith HEP and asked him to  try the NuStep at his place   Consulted and Agree with Plan of Care Patient      Patient will benefit from skilled therapeutic intervention in order to improve the following deficits and impairments:  Abnormal gait, Cardiopulmonary status limiting activity, Decreased activity tolerance, Decreased balance, Decreased mobility, Decreased endurance, Decreased range of motion, Decreased strength, Difficulty walking, Postural dysfunction, Improper body mechanics  Visit Diagnosis: Difficulty in walking, not elsewhere classified - Plan: PT plan of care cert/re-cert  Muscle weakness (generalized) - Plan: PT plan of care cert/re-cert  Repeated falls - Plan: PT plan of care cert/re-cert       G-Codes - May 08, 2017 1308    Functional Assessment Tool Used (Outpatient Only) foto 63% limitation   Functional Limitation Mobility: Walking and moving around   Mobility: Walking and Moving Around Current Status ([id number]) At least 60 percent but less than 80 percent impaired, limited or restricted   Mobility: Walking and Moving Around Goal Status [phone]) At least 60 percent but less than 80 percent impaired, limited or restricted      Problem List Patient Active Problem List   Diagnosis Date Noted  . Pleural effusion, left 06/15/2013  . Atrial flutter ([place]) 05/06/2013  . Chronic airway obstruction, not elsewhere classified 04/21/2013  . CHF (congestive heart failure) ([place]) 04/21/2013  . Atrial fibrillation ([place]) 04/21/2013  . Encounter for long-term (current) use of other medications 04/21/2013  . Edema 04/21/2013  . Memory loss 04/21/2013    Sumner Boast., PT 05-08-17, 1:47 PM  [place] [address] [place], [place], [address] Phone: [phone]   Fax:  [phone]  Name: John Cherry MRN: [id number] Date of Birth: 04-25-1918

## 2017-05-08 ENCOUNTER — Ambulatory Visit: Payer: Medicare HMO | Admitting: Physical Therapy

## 2017-05-10 DIAGNOSIS — M25511 Pain in right shoulder: Secondary | ICD-10-CM | POA: Diagnosis not present

## 2017-05-10 DIAGNOSIS — Z79899 Other long term (current) drug therapy: Secondary | ICD-10-CM | POA: Diagnosis not present

## 2017-05-10 DIAGNOSIS — I481 Persistent atrial fibrillation: Secondary | ICD-10-CM | POA: Diagnosis not present

## 2017-05-10 DIAGNOSIS — I872 Venous insufficiency (chronic) (peripheral): Secondary | ICD-10-CM | POA: Diagnosis not present

## 2017-05-10 DIAGNOSIS — I5022 Chronic systolic (congestive) heart failure: Secondary | ICD-10-CM | POA: Diagnosis not present

## 2017-05-10 DIAGNOSIS — K839 Disease of biliary tract, unspecified: Secondary | ICD-10-CM | POA: Diagnosis not present

## 2017-05-15 ENCOUNTER — Ambulatory Visit: Payer: Medicare HMO | Admitting: Physical Therapy

## 2017-05-17 DIAGNOSIS — I872 Venous insufficiency (chronic) (peripheral): Secondary | ICD-10-CM | POA: Diagnosis not present

## 2017-05-22 ENCOUNTER — Ambulatory Visit: Payer: Medicare HMO | Admitting: Physical Therapy

## 2017-05-29 ENCOUNTER — Encounter: Payer: Self-pay | Admitting: Physical Therapy

## 2017-05-29 ENCOUNTER — Ambulatory Visit: Payer: Medicare HMO | Attending: Geriatric Medicine | Admitting: Physical Therapy

## 2017-05-29 DIAGNOSIS — R296 Repeated falls: Secondary | ICD-10-CM | POA: Diagnosis not present

## 2017-05-29 DIAGNOSIS — M6281 Muscle weakness (generalized): Secondary | ICD-10-CM | POA: Insufficient documentation

## 2017-05-29 DIAGNOSIS — R262 Difficulty in walking, not elsewhere classified: Secondary | ICD-10-CM | POA: Insufficient documentation

## 2017-05-29 NOTE — Therapy (Signed)
[place], [place], [address] Phone: [phone]   Fax:  [phone]  Physical Therapy Treatment  Patient Details  Name: John Cherry MRN: [id number] Date of Birth: 1918-06-24 Referring Provider: Lajean Manes  Encounter Date: 05/29/2017      PT End of Session - 05/29/17 1356    Visit Number 21   Date for PT Re-Evaluation 06/29/17   PT Start Time 1304   PT Stop Time 1349   PT Time Calculation (min) 45 min   Activity Tolerance Patient tolerated treatment well   Behavior During Therapy [facility] for tasks assessed/performed      Past Medical History:  Diagnosis Date  . Abnormal mini-mental status exam February 2014   24/30  . Atrial fibrillation ([place])   . CHF (congestive heart failure) ([place])   . COPD (chronic obstructive pulmonary disease) ([place])   . H/O TB (tuberculosis)    as a teenager  . Presbycusis     Past Surgical History:  Procedure Laterality Date  . CT [facility] CHOLECYSTOSTOMY  February 2014  . TRANSURETHRAL RESECTION OF PROSTATE  November 2013    There were no vitals filed for this visit.      Subjective Assessment - 05/29/17 1354    Subjective Patient has not been in to see [place] in a month, the duaghter reports an infection and significant cellulitis/dermatitis of the lower legs with weeping fluid.  Also reports some gall bladder isssues   Currently in Pain? No/denies                         [facility] Adult PT Treatment/Exercise - 05/29/17 0001      Knee/Hip Exercises: Stretches   Gastroc Stretch 3 reps;20 seconds     Knee/Hip Exercises: Aerobic   Nustep L 5 x 10 min     Knee/Hip Exercises: Machines for Strengthening   Other Machine seated green tband scapular stabilization 2 ways,      Knee/Hip Exercises: Standing   Heel Raises Both;2 sets;10 reps   Hip Flexion Both;20 reps   Hip Abduction Both;2 sets;10 reps   Hip Extension Both;2 sets;10 reps     Knee/Hip Exercises: Seated   Ball Squeeze 30   Other Seated Knee/Hip Exercises red tband ankle PF/DF 3x10 each, yellow tband shoulder ER, toe taps, heel raises in sitting, trunk rotation with red tband                PT Education - 05/29/17 1356    Education provided Yes   Education Details His caregiver came in and we went over all the things that could be done at home, we performed them and discussed when not to do if he had pain in a joint   Person(s) Educated Patient;Caregiver(s)   Methods Explanation;Demonstration;Handout   Comprehension Verbalized understanding          PT Short Term Goals - 02/13/17 1403      PT SHORT TERM GOAL #1   Title have him try the NuStep where he lives at least 2x/week   Time 8   Period Weeks   Status On-going           PT Long Term Goals - 05/29/17 1401      PT LONG TERM GOAL #1   Title decrease TUG time to 30 seconds   Status Partially Met     PT LONG TERM GOAL #2   Title  increase Berg balance score to 20/56               Plan - 05/29/17 1400    Clinical Impression Statement Patient has been out for a month with health complications of significant swelling of the LE's with weeping, has had gall bladder issues and left hip pain.  He has not walked much due to the left hip pain, his hip was not tender and did not have any pain with the sitting or stnading exercises, reports pain only iwth walking and less over the past two days.   PT Frequency 1x / week   PT Duration 4 weeks   PT Treatment/Interventions ADLs/Self Care Home Management;Gait training;Stair training;Functional mobility training;Patient/family education;Neuromuscular re-education;Balance training;Therapeutic exercise;Therapeutic activities;Manual techniques   PT Next Visit Plan I continue to encourage him to walk at his place, encourage the CNA to helpw ith HEP and asked him to try the NuStep at his place   Consulted and Agree with Plan of Care Patient   Family  Member Boykin Nearing, daughter      Patient will benefit from skilled therapeutic intervention in order to improve the following deficits and impairments:  Abnormal gait, Cardiopulmonary status limiting activity, Decreased activity tolerance, Decreased balance, Decreased mobility, Decreased endurance, Decreased range of motion, Decreased strength, Difficulty walking, Postural dysfunction, Improper body mechanics  Visit Diagnosis: Difficulty in walking, not elsewhere classified - Plan: PT plan of care cert/re-cert  Muscle weakness (generalized) - Plan: PT plan of care cert/re-cert  Repeated falls - Plan: PT plan of care cert/re-cert     Problem List Patient Active Problem List   Diagnosis Date Noted  . Pleural effusion, left 06/15/2013  . Atrial flutter ([place]) 05/06/2013  . Chronic airway obstruction, not elsewhere classified 04/21/2013  . CHF (congestive heart failure) ([place]) 04/21/2013  . Atrial fibrillation ([place]) 04/21/2013  . Encounter for long-term (current) use of other medications 04/21/2013  . Edema 04/21/2013  . Memory loss 04/21/2013    Sumner Boast., PT 05/29/2017, 2:03 PM  [place] [address] [place], [place], [address] Phone: [phone]   Fax:  [phone]  Name: John Cherry MRN: [id number] Date of Birth: 17-Oct-1917

## 2017-05-31 DIAGNOSIS — I872 Venous insufficiency (chronic) (peripheral): Secondary | ICD-10-CM | POA: Diagnosis not present

## 2017-06-05 ENCOUNTER — Encounter: Payer: Self-pay | Admitting: Physical Therapy

## 2017-06-05 ENCOUNTER — Ambulatory Visit: Payer: Medicare HMO | Admitting: Physical Therapy

## 2017-06-05 DIAGNOSIS — R296 Repeated falls: Secondary | ICD-10-CM | POA: Diagnosis not present

## 2017-06-05 DIAGNOSIS — M6281 Muscle weakness (generalized): Secondary | ICD-10-CM

## 2017-06-05 DIAGNOSIS — R262 Difficulty in walking, not elsewhere classified: Secondary | ICD-10-CM | POA: Diagnosis not present

## 2017-06-05 NOTE — Therapy (Signed)
[address] [place], [place], [address] Phone: [phone]   Fax:  [phone]  Physical Therapy Treatment  Patient Details  Name: John Cherry MRN: [id number] Date of Birth: 1917-11-19 Referring Provider: Lajean Manes  Encounter Date: 06/05/2017      PT End of Session - 06/05/17 1351    Visit Number 22   Date for PT Re-Evaluation 06/29/17   PT Start Time 1300   PT Stop Time 1345   PT Time Calculation (min) 45 min   Activity Tolerance Patient tolerated treatment well   Behavior During Therapy [facility] for tasks assessed/performed      Past Medical History:  Diagnosis Date  . Abnormal mini-mental status exam February 2014   24/30  . Atrial fibrillation ([place])   . CHF (congestive heart failure) ([place])   . COPD (chronic obstructive pulmonary disease) ([place])   . H/O TB (tuberculosis)    as a teenager  . Presbycusis     Past Surgical History:  Procedure Laterality Date  . CT [facility] CHOLECYSTOSTOMY  February 2014  . TRANSURETHRAL RESECTION OF PROSTATE  November 2013    There were no vitals filed for this visit.      Subjective Assessment - 06/05/17 1304    Subjective Less leg swelling, daughter reports that he has been doing some exercises   Currently in Pain? No/denies                         [facility] Adult PT Treatment/Exercise - 06/05/17 0001      Knee/Hip Exercises: Aerobic   Nustep L 5 x 10 min     Knee/Hip Exercises: Machines for Strengthening   Cybex Knee Extension 5# 3x10   Cybex Knee Flexion 25# 3x10   Other Machine seated green tband scapular stabilization 2 ways,      Knee/Hip Exercises: Standing   Heel Raises Both;2 sets;10 reps   Hip Flexion Both;20 reps   Hip Abduction Both;2 sets;10 reps     Knee/Hip Exercises: Seated   Ball Squeeze 30   Other Seated Knee/Hip Exercises red tband ankle PF/DF 3x10 each, yellow tband shoulder ER, toe taps, heel raises in  sitting, trunk rotation with red tband, triceps press with blue tband                  PT Short Term Goals - 02/13/17 1403      PT SHORT TERM GOAL #1   Title have him try the NuStep where he lives at least 2x/week   Time 8   Period Weeks   Status On-going           PT Long Term Goals - 06/05/17 1353      PT LONG TERM GOAL #1   Title decrease TUG time to 30 seconds   Status Partially Met               Plan - 06/05/17 1351    Clinical Impression Statement Patient with much less swelling and redness, still with swelling in the right leg and having a little more difficulty with moving the right leg, daughter reports less weeping from the leg.     PT Next Visit Plan I continue to encourage him to walk at his place, encourage the CNA to helpw ith HEP and asked him to try the NuStep at his place   Consulted and Agree with Plan of Care Patient  Patient will benefit from skilled therapeutic intervention in order to improve the following deficits and impairments:  Abnormal gait, Cardiopulmonary status limiting activity, Decreased activity tolerance, Decreased balance, Decreased mobility, Decreased endurance, Decreased range of motion, Decreased strength, Difficulty walking, Postural dysfunction, Improper body mechanics  Visit Diagnosis: Difficulty in walking, not elsewhere classified  Muscle weakness (generalized)  Repeated falls     Problem List Patient Active Problem List   Diagnosis Date Noted  . Pleural effusion, left 06/15/2013  . Atrial flutter ([place]) 05/06/2013  . Chronic airway obstruction, not elsewhere classified 04/21/2013  . CHF (congestive heart failure) ([place]) 04/21/2013  . Atrial fibrillation ([place]) 04/21/2013  . Encounter for long-term (current) use of other medications 04/21/2013  . Edema 04/21/2013  . Memory loss 04/21/2013    Sumner Boast., PT 06/05/2017, 1:53 PM  [place] [address] [place], [place], [address] Phone: [phone]   Fax:  [phone]  Name: John Cherry MRN: [id number] Date of Birth: [birth year]

## 2017-06-12 ENCOUNTER — Encounter: Payer: Self-pay | Admitting: Physical Therapy

## 2017-06-12 ENCOUNTER — Ambulatory Visit: Payer: Medicare HMO | Attending: Geriatric Medicine | Admitting: Physical Therapy

## 2017-06-12 DIAGNOSIS — R296 Repeated falls: Secondary | ICD-10-CM | POA: Insufficient documentation

## 2017-06-12 DIAGNOSIS — M6281 Muscle weakness (generalized): Secondary | ICD-10-CM

## 2017-06-12 DIAGNOSIS — R262 Difficulty in walking, not elsewhere classified: Secondary | ICD-10-CM | POA: Diagnosis not present

## 2017-06-12 NOTE — Therapy (Signed)
[place], [place], [address] Phone: [phone]   Fax:  [phone]  Physical Therapy Treatment  Patient Details  Name: John Cherry MRN: [id number] Date of Birth: 06/07/1918 Referring Provider: Lajean Manes   Encounter Date: 06/12/2017  PT End of Session - 06/12/17 1344    Visit Number  23    Date for PT Re-Evaluation  06/29/17    PT Start Time  1300    PT Stop Time  1342    PT Time Calculation (min)  42 min    Activity Tolerance  Patient tolerated treatment well    Behavior During Therapy  [facility] for tasks assessed/performed       Past Medical History:  Diagnosis Date  . Abnormal mini-mental status exam February 2014   24/30  . Atrial fibrillation ([place])   . CHF (congestive heart failure) ([place])   . COPD (chronic obstructive pulmonary disease) ([place])   . H/O TB (tuberculosis)    as a teenager  . Presbycusis     Past Surgical History:  Procedure Laterality Date  . CT [facility] CHOLECYSTOSTOMY  February 2014  . TRANSURETHRAL RESECTION OF PROSTATE  November 2013    There were no vitals filed for this visit.  Subjective Assessment - 06/12/17 1304    Subjective  Patient reports that he thinks he is doing better, I spoke with the caregiver and she reports that they were doing some exercises, but reports he usually says "I can't"    Currently in Pain?  No/denies         [facility] PT Assessment - 06/12/17 0001      Berg Balance Test   Sit to Stand  Able to stand using hands after several tries    Standing Unsupported  Able to stand 30 seconds unsupported    Sitting with Back Unsupported but Feet Supported on Floor or Stool  Able to sit safely and securely 2 minutes    Stand to Sit  Uses backs of legs against chair to control descent    Transfers  Able to transfer with verbal cueing and /or supervision    Standing Unsupported with Eyes Closed  Needs help to keep from falling    Standing  Ubsupported with Feet Together  Needs help to attain position but able to stand for 30 seconds with feet together    From Standing, Reach Forward with Outstretched Arm  Can reach forward >12 cm safely (5")    From Standing Position, Pick up Object from Floor  Unable to pick up and needs supervision    From Standing Position, Turn to Look Behind Over each Shoulder  Needs supervision when turning    Turn 360 Degrees  Needs assistance while turning    Standing Unsupported, Alternately Place Feet on Step/Stool  Needs assistance to keep from falling or unable to try    Standing Unsupported, One Foot in Front  Loses balance while stepping or standing    Standing on One Leg  Unable to try or needs assist to prevent fall    Total Score  18                  [facility] Adult PT Treatment/Exercise - 06/12/17 0001      Ambulation/Gait   Gait Comments  gait 220 feet prior to stopping      High Level Balance   High Level Balance Activities  Side stepping;Backward walking  High Level Balance Comments  standing ball toss with CGA/Min A to stand for balance      Knee/Hip Exercises: Aerobic   Nustep  L 5 x 12 min      Knee/Hip Exercises: Machines for Strengthening   Other Machine  seated green tband scapular stabilization 2 ways, green tband tricep extnesion      Knee/Hip Exercises: Standing   Hip Flexion  Both;20 reps    Hip Abduction  Both;2 sets;10 reps      Knee/Hip Exercises: Seated   Other Seated Knee/Hip Exercises  red tband ankle PF/DF 3x10 each, yellow tband shoulder ER, toe taps, heel raises in sitting, trunk rotation with red tband, triceps press with blue tband               PT Short Term Goals - 06/12/17 1346      PT SHORT TERM GOAL #1   Title  have him try the NuStep where he lives at least 2x/week    Status  Partially Met        PT Long Term Goals - 06/05/17 1353      PT LONG TERM GOAL #1   Title  decrease TUG time to 30 seconds    Status  Partially Met             Plan - 06/12/17 1345    Clinical Impression Statement  Patient has some swelling and redness in the legs again today, there is skin sloughing off due to the significant recent swelling.  Did well with all activities, had pain in the shoulders with some activities, left > right    PT Next Visit Plan  I continue to encourage him to walk at his place, encourage the CNA to helpw ith HEP and asked him to try the NuStep at his place    Consulted and Agree with Plan of Care  Patient;Family member/caregiver    Family Member Consulted  CNA, Judeen Hammans today       Patient will benefit from skilled therapeutic intervention in order to improve the following deficits and impairments:  Abnormal gait, Cardiopulmonary status limiting activity, Decreased activity tolerance, Decreased balance, Decreased mobility, Decreased endurance, Decreased range of motion, Decreased strength, Difficulty walking, Postural dysfunction, Improper body mechanics  Visit Diagnosis: Difficulty in walking, not elsewhere classified  Muscle weakness (generalized)  Repeated falls     Problem List Patient Active Problem List   Diagnosis Date Noted  . Pleural effusion, left 06/15/2013  . Atrial flutter ([place]) 05/06/2013  . Chronic airway obstruction, not elsewhere classified 04/21/2013  . CHF (congestive heart failure) ([place]) 04/21/2013  . Atrial fibrillation ([place]) 04/21/2013  . Encounter for long-term (current) use of other medications 04/21/2013  . Edema 04/21/2013  . Memory loss 04/21/2013    Sumner Boast., PT 06/12/2017, 1:47 PM  [place] [address] [address] [place], [place], [address] Phone: [phone]   Fax:  [phone]  Name: John Cherry MRN: [id number] Date of Birth: 23-May-1918

## 2017-06-17 ENCOUNTER — Encounter: Payer: Self-pay | Admitting: Cardiology

## 2017-06-19 ENCOUNTER — Ambulatory Visit: Payer: Medicare HMO | Admitting: Physical Therapy

## 2017-06-19 ENCOUNTER — Encounter: Payer: Self-pay | Admitting: Physical Therapy

## 2017-06-19 DIAGNOSIS — R296 Repeated falls: Secondary | ICD-10-CM

## 2017-06-19 DIAGNOSIS — R262 Difficulty in walking, not elsewhere classified: Secondary | ICD-10-CM | POA: Diagnosis not present

## 2017-06-19 DIAGNOSIS — M6281 Muscle weakness (generalized): Secondary | ICD-10-CM | POA: Diagnosis not present

## 2017-06-19 NOTE — Therapy (Signed)
[place], [place], [address] Phone: [phone]   Fax:  [phone]  Physical Therapy Treatment  Patient Details  Name: John Cherry MRN: [id number] Date of Birth: Jul 08, 1918 Referring Provider: Lajean Manes   Encounter Date: 06/19/2017  PT End of Session - 06/19/17 1532    Visit Number  24    Date for PT Re-Evaluation  06/29/17    PT Start Time  1435    PT Stop Time  1525    PT Time Calculation (min)  50 min    Activity Tolerance  Patient tolerated treatment well    Behavior During Therapy  [facility] for tasks assessed/performed       Past Medical History:  Diagnosis Date  . Abnormal mini-mental status exam February 2014   24/30  . Atrial fibrillation ([place])   . CHF (congestive heart failure) ([place])   . COPD (chronic obstructive pulmonary disease) ([place])   . H/O TB (tuberculosis)    as a teenager  . Presbycusis     Past Surgical History:  Procedure Laterality Date  . CT [facility] CHOLECYSTOSTOMY  February 2014  . TRANSURETHRAL RESECTION OF PROSTATE  November 2013    There were no vitals filed for this visit.  Subjective Assessment - 06/19/17 1444    Subjective  Patient reports that he has done the exercises a little more often over the past week    Currently in Pain?  No/denies                      [facility] Adult PT Treatment/Exercise - 06/19/17 0001      Transfers   Comments  worked with daughter as she reporrts her shoulder is hurting from helping patient get up, patient needs cues for nose over toes, she tends to start pulling and then he resists      Ambulation/Gait   Gait Comments  gait 220 feet prior to stopping      High Level Balance   High Level Balance Comments  standing ball toss with CGA/Min A to stand for balance      Knee/Hip Exercises: Aerobic   Nustep  L 5 x 12 min      Knee/Hip Exercises: Machines for Strengthening   Other Machine  seated green  tband scapular stabilization 2 ways, green tband tricep extnesion      Knee/Hip Exercises: Standing   Knee Flexion  Both;2 sets;10 reps 5#    Hip Flexion  Both;20 reps    Hip Flexion Limitations  2#    Hip Abduction  Both;2 sets;10 reps    Abduction Limitations  2#      Knee/Hip Exercises: Seated   Long Arc Quad  Both;2 sets;15 reps    Long Arc Quad Weight  5 lbs.    Ball Squeeze  30    Other Seated Knee/Hip Exercises  red tband ankle PF/DF 3x10 each, yellow tband shoulder ER, toe taps, heel raises in sitting, trunk rotation with red tband, triceps press with blue tband    Marching  Both;2 sets;10 reps    Marching Limitations  worked on hip abd/adduction with 4" step clearing to simulate in/out of car    Marching Weights  5 lbs.               PT Short Term Goals - 06/12/17 1346      PT SHORT TERM GOAL #1   Title  have him  try the NuStep where he lives at least 2x/week    Status  Partially Met        PT Long Term Goals - 06/19/17 1534      PT LONG TERM GOAL #1   Title  decrease TUG time to 30 seconds    Status  Partially Met      PT LONG TERM GOAL #4   Title  walk 300 feet without rest    Status  Partially Met            Plan - 06/19/17 1533    Clinical Impression Statement  Patients daughter reporting some difficulty at times getting him upo from sitting, he has very bad left shoulder and cannot push up well.  Gave them some cues to help out with htis    PT Next Visit Plan  I continue to encourage him to walk at his place, encourage the CNA to helpw ith HEP and asked him to try the NuStep at his place    Consulted and Agree with Plan of Care  Patient;Family member/caregiver       Patient will benefit from skilled therapeutic intervention in order to improve the following deficits and impairments:  Abnormal gait, Cardiopulmonary status limiting activity, Decreased activity tolerance, Decreased balance, Decreased mobility, Decreased endurance, Decreased  range of motion, Decreased strength, Difficulty walking, Postural dysfunction, Improper body mechanics  Visit Diagnosis: Difficulty in walking, not elsewhere classified  Muscle weakness (generalized)  Repeated falls     Problem List Patient Active Problem List   Diagnosis Date Noted  . Pleural effusion, left 06/15/2013  . Atrial flutter ([place]) 05/06/2013  . Chronic airway obstruction, not elsewhere classified 04/21/2013  . CHF (congestive heart failure) ([place]) 04/21/2013  . Atrial fibrillation ([place]) 04/21/2013  . Encounter for long-term (current) use of other medications 04/21/2013  . Edema 04/21/2013  . Memory loss 04/21/2013    Sumner Boast., PT 06/19/2017, 3:34 PM  [place] [address] [place], [place], [address] Phone: [phone]   Fax:  [phone]  Name: ODILON CASS MRN: [id number] Date of Birth: Jun 25, 1918

## 2017-06-24 ENCOUNTER — Ambulatory Visit: Payer: Medicare HMO | Admitting: Physical Therapy

## 2017-06-25 ENCOUNTER — Ambulatory Visit
Admission: RE | Admit: 2017-06-25 | Discharge: 2017-06-25 | Disposition: A | Discharge status: Home / Self Care | Payer: Medicare HMO | Source: Ambulatory Visit | Attending: Geriatric Medicine | Admitting: Geriatric Medicine

## 2017-06-25 ENCOUNTER — Other Ambulatory Visit: Payer: Self-pay | Admitting: Geriatric Medicine

## 2017-06-25 DIAGNOSIS — M5441 Lumbago with sciatica, right side: Secondary | ICD-10-CM

## 2017-06-25 DIAGNOSIS — M549 Dorsalgia, unspecified: Secondary | ICD-10-CM

## 2017-06-25 DIAGNOSIS — R0789 Other chest pain: Secondary | ICD-10-CM | POA: Diagnosis not present

## 2017-06-25 DIAGNOSIS — S299XXA Unspecified injury of thorax, initial encounter: Secondary | ICD-10-CM | POA: Diagnosis not present

## 2017-06-25 DIAGNOSIS — M546 Pain in thoracic spine: Secondary | ICD-10-CM | POA: Diagnosis not present

## 2017-06-26 ENCOUNTER — Ambulatory Visit: Payer: Medicare HMO | Admitting: Physical Therapy

## 2017-07-02 ENCOUNTER — Ambulatory Visit: Payer: Medicare HMO | Admitting: Cardiology

## 2017-07-02 ENCOUNTER — Encounter: Payer: Self-pay | Admitting: Cardiology

## 2017-07-02 VITALS — BP 98/66 | HR 54 | Ht 70.0 in | Wt 200.8 lb

## 2017-07-02 DIAGNOSIS — Z7901 Long term (current) use of anticoagulants: Secondary | ICD-10-CM | POA: Diagnosis not present

## 2017-07-02 DIAGNOSIS — I482 Chronic atrial fibrillation: Secondary | ICD-10-CM

## 2017-07-02 DIAGNOSIS — R600 Localized edema: Secondary | ICD-10-CM | POA: Diagnosis not present

## 2017-07-02 DIAGNOSIS — I4892 Unspecified atrial flutter: Secondary | ICD-10-CM | POA: Diagnosis not present

## 2017-07-02 DIAGNOSIS — I4821 Permanent atrial fibrillation: Secondary | ICD-10-CM

## 2017-07-02 NOTE — Patient Instructions (Signed)

## 2017-07-02 NOTE — Progress Notes (Signed)
Patient ID: John Cherry, male   DOB: 1918/07/19, 83 y.o.   MRN: [id number]        [address], [address] [place], [place]  [address] Phone: [phone] Fax:  [phone]  Date:  07/02/2017   ID:  John Cherry, DOB May 21, 1918, MRN [id number]  PCP:  John Manes, MD   History of Present Illness: John Cherry is a 83 y.o. male with chronic atrial fibrillation/flutter on chronic anticoagulation with history of diastolic heart failure while in [place] here for follow-up. Chronic and a degree of venous insufficiency as well. Thankfully, BNP was 98. Hemoglobin 13.3. Creatinine is 1.12- 1.3 to with potassium of 4.1. He has been on twice a day Lasix 40 mg.  2011-06-06 wife died. At the end of 11/04/11 went into hospital. Diastolic HF episode, ?PNA. TURP 11/13. Rehospitalization because of cholecystitis. Daughter is Firefighter. Hypotension with anesthesia. Never took gall bladder out because of hypotension. Wanted to move here. 04/09/13. [place] independent living.   Cut back on medications. Very little COPD. Off nebs. Off neurontin, cause swelling. Off digoxin. Currently doing quite well. Has a strong will to live. Wants to live to 89, he previously said.  No bleeding on Eliquis. Overall doing quite well. No chest pain, no shortness of breath, no syncope, no fevers.  Doing well, enjoyed going to [place] previously.   Was in a wedding in [place]. Walked to the hotel. Overall doing well. Daughter moving to [place]. Second row.  01/01/17-has been doing quite well. He did have Pseudomonas in his urine, took Cipro then Keflex then Levaquin. His daughter states that the 500 mg dose of Levaquin for 3 days debilitated and quite a bit. However, his cough cleared. No chest pain, no shortness of breath. They were at [facility] and saw mother bear.  07/02/17-has been going to outpatient rehabilitation for difficulty in walking. ABX for gallbladder infection. Legs then became worse,  edema. HR was racing, diltiazem was increased to 180 to help. Dr. Nevada Crane derm saw him. Helped with legs. Hip pain with abx. Challeging to walk. Does great at rehab.   Wt Readings from Last 3 Encounters:  07/02/17 200 lb 12.8 oz (91.1 kg)  01/01/17 185 lb 9.6 oz (84.2 kg)  06/26/16 196 lb 9.6 oz (89.2 kg)     Past Medical History:  Diagnosis Date  . Abnormal mini-mental status exam February 2014   24/30  . Atrial fibrillation ([place])   . CHF (congestive heart failure) ([place])   . COPD (chronic obstructive pulmonary disease) ([place])   . H/O TB (tuberculosis)    as a teenager  . Presbycusis     Past Surgical History:  Procedure Laterality Date  . CT [facility] CHOLECYSTOSTOMY  February 2014  . TRANSURETHRAL RESECTION OF PROSTATE  November 2013    Current Outpatient Medications  Medication Sig Dispense Refill  . acetaminophen (TYLENOL) 500 MG tablet Take 500 mg by mouth every 6 (six) hours as needed for headache (pain).    Marland Kitchen apixaban (ELIQUIS) 2.5 MG TABS tablet Take 2.5 mg by mouth 2 (two) times daily.    Marland Kitchen diltiazem (CARDIZEM CD) 180 MG 24 hr capsule Take 180 mg by mouth daily.  1  . furosemide (LASIX) 40 MG tablet Take 80 mg by mouth daily. Take 2 tablets every morning    . potassium chloride (K-DUR) 10 MEQ tablet Take 10 mEq by mouth 2 (two) times daily.      No current facility-administered medications  for this visit.     Allergies:    Allergies  Allergen Reactions  . Flonase [Fluticasone Propionate] Other (See Comments)    Doesn't agree with him.   . Sulfa Antibiotics Other (See Comments)    Unknown reaction    Social History:  The patient  reports that he quit smoking about 53 years ago. His smoking use included cigarettes. He has a 25.00 pack-year smoking history. he has never used smokeless tobacco. He reports that he drinks alcohol. He reports that he does not use drugs.   Family history is noncontributory.  ROS:  Please see the history of present illness.   No syncope, no  recent falls, no bleeding. Positive for skin cancers. All other systems reviewed and negative.   PHYSICAL EXAM: VS:  BP (!) 94/58   Pulse (!) 109   Ht 5\' 10"  (1.778 m)   Wt 200 lb 12.8 oz (91.1 kg)   SpO2 (!) 87%   BMI 28.81 kg/m  Well nourished, well developed, in no acute distress, elderly male, uses Walker HEENT: Normal NCAT, EOMI Neck: no JVD No obvious carotid bruits, normal upstroke Cardiac:  Regular; soft systolic murmur right upper sternal border  Lungs: mild wheezing bilaterally, rhonchi or rales Normal respiratory effort Abd: soft, nontender, no hepatomegaly  Ext: Pretibial edema, mild erythema bilaterally 1+  Skin: warm and dry , some shine noted to pretibial region. GU: Deferred Neuro: no focal abnormalities noted  EKG:  EKG ordered 07/02/17-atrial flutter heart rate 109 bpm, variable conduction, nonspecific ST-T wave changes, PVC noted personally viewed-prior 12/21/15-HR fibrillation/flutter 92 bpm, old septal infarct-like pattern. Personally viewed. Prior- Atrial flutter heart rate of 60, 4-1 conduction, left axis deviation, poor R wave progression.   Lab work-recent creatinine stable, Dr. Felipa Eth.  ASSESSMENT AND PLAN:  83 year old with chronic atrial fibrillation/flutter, chronic anticoagulation, chronic venous insufficiency/lower extremity edema, chronic kidney disease stage III.  1. Atrial flutter/fibrillation-continue diltiazem.  Previously was rate controlled with 4-1 conduction however in the setting of his recent illness, his heart rate increased.  Dr. Felipa Eth increased his diltiazem to 180.  Continue with this current dose.  His heart rate is less than 120 overall.   2. Chronic anticoagulation-doing well on low-dose Eliquis. No bleeding. In fact, had Mohs surgery previously on left face without difficulty. Did not have to stop Eliquis. No serious falls. Uses walker. Stable.  Continue with therapy. 3. Chronic diastolic heart failure-actually, BNP was 92, normal.  I will continue with Lasix 40 mg BID, continue to encourage elevation, walking. Continue with daily weights. Creatinine is reassuring previously. Monitor salt,  fluids. I explained to them that overdiuresis may result in more difficulties such as dizziness, hypotension, worsening renal function.  He is not feeling any symptoms at this point. 4. COPD -  No changes.  Daughter states that improved with antibiotics.  Pulse ox 87-88.  Note that this is with his atrial flutter. 5. Chronic venous insufficiency/edema-we gave him a prescription for compression hose previously. Has not needed to use these. Challenging to put on. Mild erythema noted on extremities bilaterally has improved after seeing Dr. Nevada Crane, dermatology. 6. 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 7. Prior illness with gallbladder, white count was elevated, antibiotic utilized, white count subsequently has returned to normal, reviewed from Dr. Carlyle Lipa lab work last done in October.   Prior medical records reviewed, we will see him back in 6 months.  Signed, Candee Furbish, MD [facility]  07/02/2017 11:46 AM

## 2017-07-03 ENCOUNTER — Ambulatory Visit: Payer: Medicare HMO | Admitting: Physical Therapy

## 2017-07-03 ENCOUNTER — Encounter: Payer: Self-pay | Admitting: Physical Therapy

## 2017-07-03 DIAGNOSIS — M6281 Muscle weakness (generalized): Secondary | ICD-10-CM

## 2017-07-03 DIAGNOSIS — R262 Difficulty in walking, not elsewhere classified: Secondary | ICD-10-CM

## 2017-07-03 DIAGNOSIS — R296 Repeated falls: Secondary | ICD-10-CM | POA: Diagnosis not present

## 2017-07-03 NOTE — Therapy (Signed)
[place] [address] [place], [place], [address] Phone: [phone]   Fax:  [phone]  Physical Therapy Treatment  Patient Details  Name: John Cherry MRN: [id number] Date of Birth: [birth year] Referring Provider: Lajean Manes   Encounter Date: 07/03/2017  PT End of Session - 07/03/17 1524    Visit Number  25    Date for PT Re-Evaluation  07/29/17    PT Start Time  1350    PT Stop Time  1435    PT Time Calculation (min)  45 min    Activity Tolerance  Patient limited by pain    Behavior During Therapy  [facility] for tasks assessed/performed       Past Medical History:  Diagnosis Date  . Abnormal mini-mental status exam February 2014   24/30  . Atrial fibrillation ([place])   . CHF (congestive heart failure) ([place])   . COPD (chronic obstructive pulmonary disease) ([place])   . H/O TB (tuberculosis)    as a teenager  . Presbycusis     Past Surgical History:  Procedure Laterality Date  . CT [facility] CHOLECYSTOSTOM[facility]  February 2014  . TRANSURETHRAL RESECTION OF PROSTATE  November 2013    There were no vitals filed for this visit.  Subjective Assessment - 07/03/17 1355    Subjective  Patient and caregiver reports that he had a fall about 2 weeks ago, he reports tripping.  When walking in today he did catch his toe .  He had x-rays all negative    Currently in Pain?  Yes    Pain Score  3     Pain Location  Back    Pain Orientation  Right;Lower    Pain Descriptors / Indicators  Sore    Pain Type  Acute pain                      [facility] Adult PT Treatment/Exercise - 07/03/17 0001      Ambulation/Gait   Gait Comments  gait with 4WW, cues to pick up feet, 180 feet x 2      High Level Balance   High Level Balance Comments  standing ball toss with CGA/Min A to stand for balance      Knee/Hip Exercises: Aerobic   Nustep  L 5 x 12 min      Knee/Hip Exercises: Machines for Strengthening   Other Machine   seated green tband scapular stabilization 2 ways, green tband tricep extnesion, bicep curls      Knee/Hip Exercises: Seated   Long Arc Quad  Both;2 sets;15 reps    Long Arc Quad Weight  5 lbs.    Ball Squeeze  30    Other Seated Knee/Hip Exercises  red tband ankle PF/DF 3x10 each, yellow tband shoulder ER, toe taps, heel raises in sitting, trunk rotation with red tband, triceps press with blue tband    Marching  Both;2 sets;10 reps    Marching Limitations  worked on hip abd/adduction with 4" step clearing to simulate in/out of car    Marching Weights  5 lbs.    Hamstring Curl  Strengthening;Both;3 sets;10 reps    Hamstring Limitations  green tband               PT Short Term Goals - 06/12/17 1346      PT SHORT TERM GOAL #1   Title  have him try the NuStep where he lives at least 2x/week  Status  Partially Met        PT Long Term Goals - 07/03/17 1527      PT LONG TERM GOAL #1   Title  decrease TUG time to 30 seconds    Status  Partially Met      PT LONG TERM GOAL #2   Title  increase Berg balance score to 20/56    Status  Partially Met      PT LONG TERM GOAL #3   Title  increase LE strength to 4/5    Status  Partially Met      PT LONG TERM GOAL #4   Title  walk 300 feet without rest    Status  Achieved            Plan - 07/03/17 1524    Clinical Impression Statement  Patient had a fall about 2 weeks ago, x-rays negative, today he c/o some right flank pain, he was having trouble breathing, had rattles and wheezing, the caregiver reports that he saw the cardiologist yesterday and all was well.  His O2 saturation after exercise was down to 82% but with rest would go up to 88%.  With walking today he had some increased stumbles catching the right foot.    PT Frequency  1x / week    PT Duration  4 weeks    PT Next Visit Plan  I told patient, son in law and the care giver to be sure to watch his breathing since he was much more congested and haivng some  difficulty breathing    Consulted and Agree with Plan of Care  Patient;Family member/caregiver       Patient will benefit from skilled therapeutic intervention in order to improve the following deficits and impairments:  Abnormal gait, Cardiopulmonary status limiting activity, Decreased activity tolerance, Decreased balance, Decreased mobility, Decreased endurance, Decreased range of motion, Decreased strength, Difficulty walking, Postural dysfunction, Improper body mechanics  Visit Diagnosis: Difficulty in walking, not elsewhere classified - Plan: PT plan of care cert/re-cert  Muscle weakness (generalized) - Plan: PT plan of care cert/re-cert  Repeated falls - Plan: PT plan of care cert/re-cert     Problem List Patient Active Problem List   Diagnosis Date Noted  . Pleural effusion, left 06/15/2013  . Atrial flutter ([place]) 05/06/2013  . Chronic airway obstruction, not elsewhere classified 04/21/2013  . CHF (congestive heart failure) ([place]) 04/21/2013  . Atrial fibrillation ([place]) 04/21/2013  . Encounter for long-term (current) use of other medications 04/21/2013  . Edema 04/21/2013  . Memory loss 04/21/2013    Sumner Boast., PT 07/03/2017, 3:32 PM  [address] [place], [place], [address] Phone: [phone]   Fax:  [phone]  Name: LYAM PROVENCIO MRN: [id number] Date of Birth: May 02, 1918

## 2017-07-10 ENCOUNTER — Ambulatory Visit: Payer: Medicare HMO | Attending: Geriatric Medicine | Admitting: Physical Therapy

## 2017-07-10 ENCOUNTER — Encounter: Payer: Self-pay | Admitting: Physical Therapy

## 2017-07-10 DIAGNOSIS — R296 Repeated falls: Secondary | ICD-10-CM | POA: Diagnosis not present

## 2017-07-10 DIAGNOSIS — M6281 Muscle weakness (generalized): Secondary | ICD-10-CM | POA: Diagnosis not present

## 2017-07-10 DIAGNOSIS — R262 Difficulty in walking, not elsewhere classified: Secondary | ICD-10-CM | POA: Insufficient documentation

## 2017-07-10 NOTE — Therapy (Signed)
[address] [place], [place], [address] Phone: [phone]   Fax:  [phone]  Physical Therapy Treatment  Patient Details  Name: John Cherry MRN: [id number] Date of Birth: [birth year] Referring Provider: Lajean Manes   Encounter Date: 07/10/2017  PT End of Session - 07/10/17 1524    Visit Number  26    Date for PT Re-Evaluation  07/29/17    PT Start Time  1435    PT Stop Time  1525    PT Time Calculation (min)  50 min    Activity Tolerance  Patient tolerated treatment well    Behavior During Therapy  [facility] for tasks assessed/performed       Past Medical History:  Diagnosis Date  . Abnormal mini-mental status exam February 2014   24/30  . Atrial fibrillation ([place])   . CHF (congestive heart failure) ([place])   . COPD (chronic obstructive pulmonary disease) ([place])   . H/O TB (tuberculosis)    as a teenager  . Presbycusis     Past Surgical History:  Procedure Laterality Date  . CT [facility] CHOLECYSTOSTOMY  February 2014  . TRANSURETHRAL RESECTION OF PROSTATE  November 2013    There were no vitals filed for this visit.  Subjective Assessment - 07/10/17 1446    Subjective  Patient appears to be doing better than when I saw him last week, he is breathing better, no c/o pain, no reports of falls    Currently in Pain?  No/denies                      [facility] Adult PT Treatment/Exercise - 07/10/17 0001      Transfers   Comments  worked with patient, caregive and daughter on bed transfers from sit to supine and back again, told him to use the left leg(stronger) leg to help raise the other leg, today he had some abdominal pain doing this and his daughter reports that sometimes he c/o this      Ambulation/Gait   Gait Comments  gait HHA 2laps around the gym at a time, did this twice, no stumbles today      High Level Balance   High Level Balance Comments  standing ball toss with CGA/Min A to  stand for balance, cues to lean forward as he tends to lean back and use backs of legs on chair to steady himself      Knee/Hip Exercises: Aerobic   Nustep  L 5 x 13 min      Knee/Hip Exercises: Machines for Strengthening   Other Machine  seated green tband scapular stabilization 2 ways, green tband tricep extnesion, bicep curls      Knee/Hip Exercises: Seated   Long Arc Quad  Both;2 sets;15 reps    Long Arc Quad Weight  5 lbs.    Marching  Both;2 sets;10 reps    Marching Limitations  worked on hip abd/adduction with 4" step clearing to simulate in/out of car    [employer]  5 lbs.    Hamstring Curl  Strengthening;Both;3 sets;10 reps    Hamstring Limitations  green tband               PT Short Term Goals - 06/12/17 1346      PT SHORT TERM GOAL #1   Title  have him try the NuStep where he lives at least 2x/week    Status  Partially Met  PT Long Term Goals - 07/10/17 1526      PT LONG TERM GOAL #1   Title  decrease TUG time to 30 seconds    Status  Partially Met            Plan - 07/10/17 1525    Clinical Impression Statement  Patient with less difficulty breathing, he had no c/o pain today wxcept in the right abdomen with supine to sit.  He does still need cues for nose over toes to stand up from sitting.  He had no catching of toes today with walking    PT Next Visit Plan  may try some supine exercises    Consulted and Agree with Plan of Care  Patient;Family member/caregiver       Patient will benefit from skilled therapeutic intervention in order to improve the following deficits and impairments:  Abnormal gait, Cardiopulmonary status limiting activity, Decreased activity tolerance, Decreased balance, Decreased mobility, Decreased endurance, Decreased range of motion, Decreased strength, Difficulty walking, Postural dysfunction, Improper body mechanics  Visit Diagnosis: Difficulty in walking, not elsewhere classified  Muscle weakness  (generalized)  Repeated falls     Problem List Patient Active Problem List   Diagnosis Date Noted  . Pleural effusion, left 06/15/2013  . Atrial flutter ([place]) 05/06/2013  . Chronic airway obstruction, not elsewhere classified 04/21/2013  . CHF (congestive heart failure) ([place]) 04/21/2013  . Atrial fibrillation ([place]) 04/21/2013  . Encounter for long-term (current) use of other medications 04/21/2013  . Edema 04/21/2013  . Memory loss 04/21/2013    Sumner Boast., PT 07/10/2017, 3:27 PM  [address] [place], [place], [address] Phone: [phone]   Fax:  [phone]  Name: John Cherry MRN: [id number] Date of Birth: Jul 22, 1918

## 2017-07-17 ENCOUNTER — Ambulatory Visit: Payer: Medicare HMO | Admitting: Physical Therapy

## 2017-07-17 DIAGNOSIS — Z79899 Other long term (current) drug therapy: Secondary | ICD-10-CM | POA: Diagnosis not present

## 2017-07-17 DIAGNOSIS — R0602 Shortness of breath: Secondary | ICD-10-CM | POA: Diagnosis not present

## 2017-07-17 DIAGNOSIS — I5022 Chronic systolic (congestive) heart failure: Secondary | ICD-10-CM | POA: Diagnosis not present

## 2017-07-17 DIAGNOSIS — J449 Chronic obstructive pulmonary disease, unspecified: Secondary | ICD-10-CM | POA: Diagnosis not present

## 2017-07-17 DIAGNOSIS — I5021 Acute systolic (congestive) heart failure: Secondary | ICD-10-CM | POA: Diagnosis not present

## 2017-07-17 DIAGNOSIS — J9601 Acute respiratory failure with hypoxia: Secondary | ICD-10-CM | POA: Diagnosis not present

## 2017-07-19 DIAGNOSIS — J449 Chronic obstructive pulmonary disease, unspecified: Secondary | ICD-10-CM | POA: Diagnosis not present

## 2017-07-19 DIAGNOSIS — I5022 Chronic systolic (congestive) heart failure: Secondary | ICD-10-CM | POA: Diagnosis not present

## 2017-07-19 DIAGNOSIS — R0602 Shortness of breath: Secondary | ICD-10-CM | POA: Diagnosis not present

## 2017-08-16 DIAGNOSIS — L8990 Pressure ulcer of unspecified site, unspecified stage: Secondary | ICD-10-CM | POA: Diagnosis not present

## 2017-08-16 DIAGNOSIS — B078 Other viral warts: Secondary | ICD-10-CM | POA: Diagnosis not present

## 2017-08-16 DIAGNOSIS — I872 Venous insufficiency (chronic) (peripheral): Secondary | ICD-10-CM | POA: Diagnosis not present

## 2017-10-04 DEATH — deceased

## 2017-12-02 IMAGING — CR DG THORACIC SPINE 2V
2 series · 2 of 2 positions shown · non-contrast
Comparison: Chest x-ray 07/09/2016

CLINICAL DATA: Fall, upper right back pain

EXAM:
THORACIC SPINE 2 VIEWS

[w t-spine a.p. *]
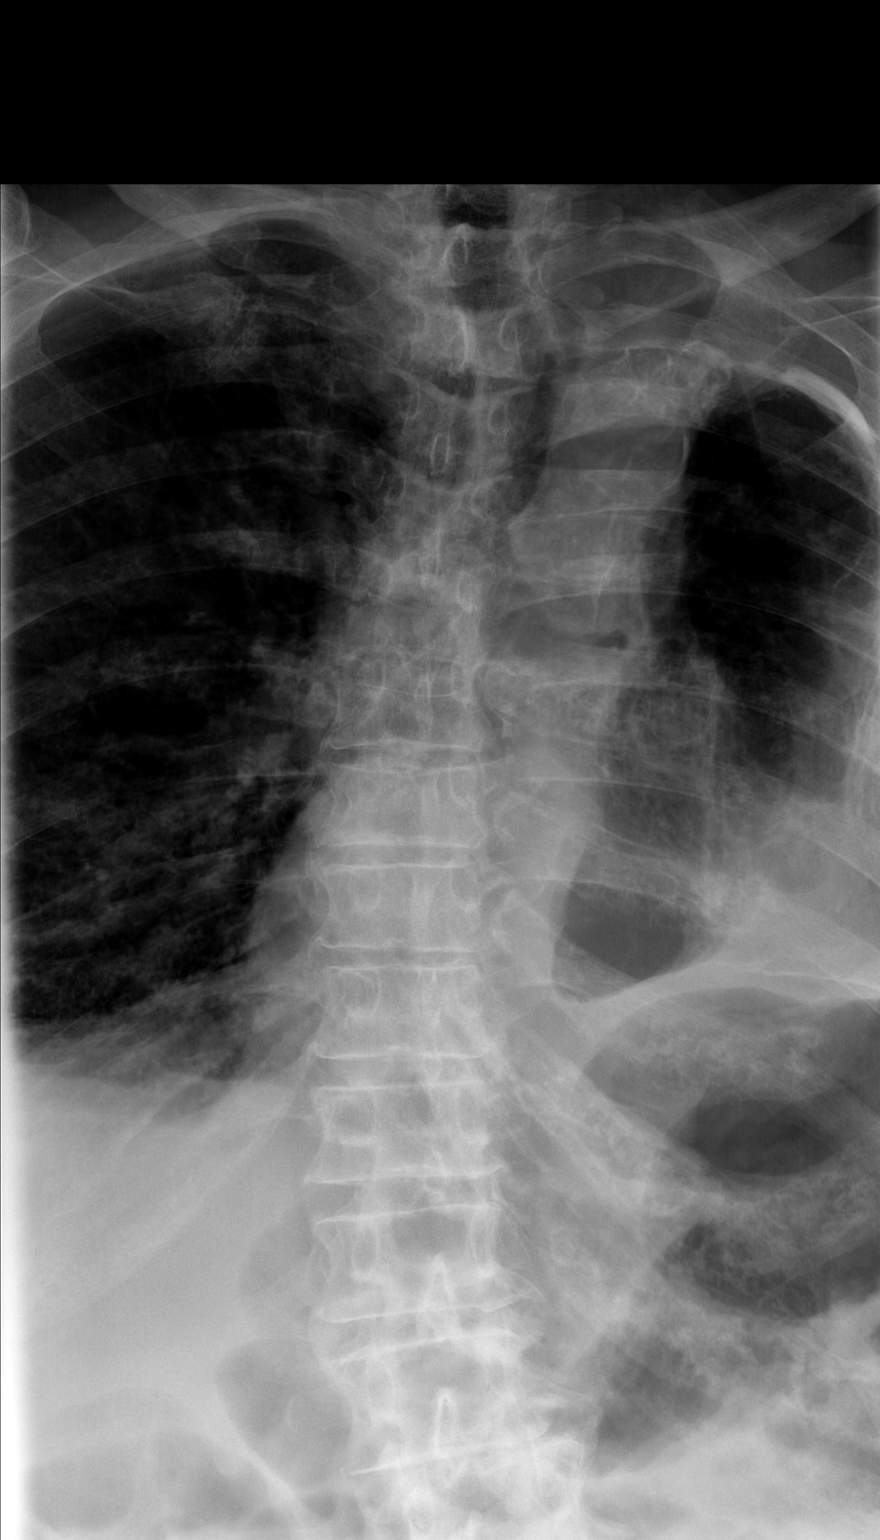

[w t-spine lat *]
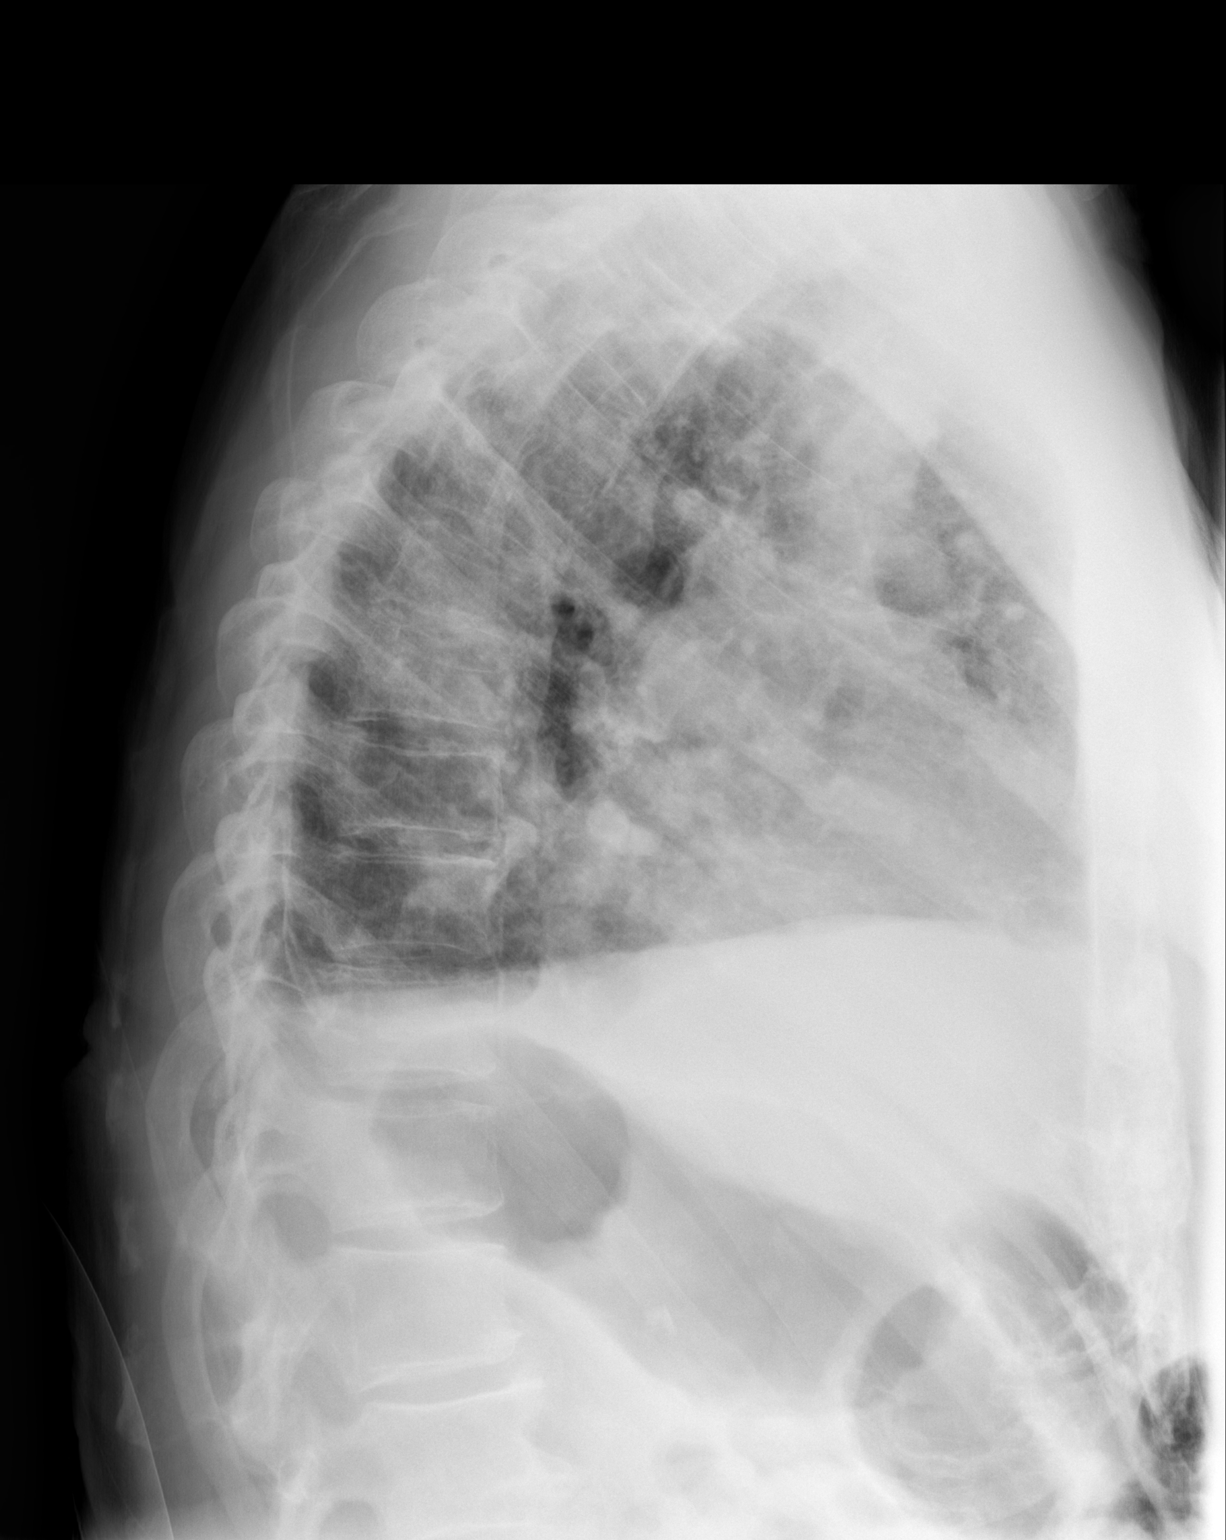

[2 of 2 positions shown; findings below may reference images not displayed]

FINDINGS: Mild generalized curvature of the thoracic spine. No fracture or
subluxation. Chronic left fibrothorax. Cardiomegaly with vascular
congestion.
IMPRESSION: No visible acute bony abnormality.

Chronic changes in the left lung.

Cardiomegaly, vascular congestion.

## 2017-12-02 IMAGING — CR DG RIBS W/ CHEST 3+V*R*
3 series · 3 of 3 positions shown · non-contrast
Comparison: Chest radiograph 07/09/2016.

CLINICAL DATA: Fall 3 days ago, pain and bruising along the right
posterior ribs, initial encounter.

EXAM:
RIGHT RIBS AND CHEST - 3+ VIEW

[w ribs ap/pa lower right *]
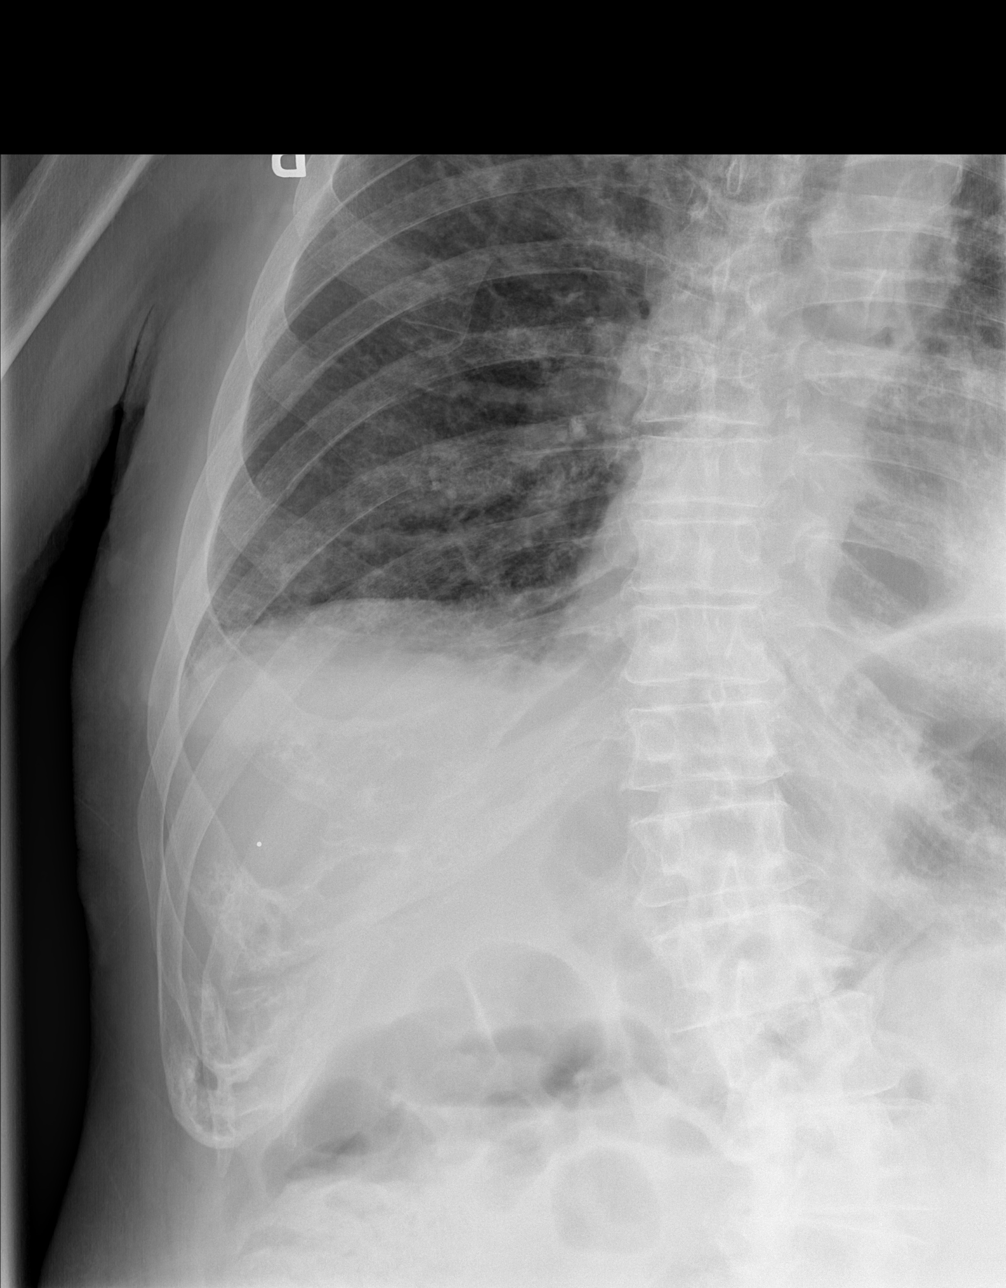

[w ribs oblique right *]
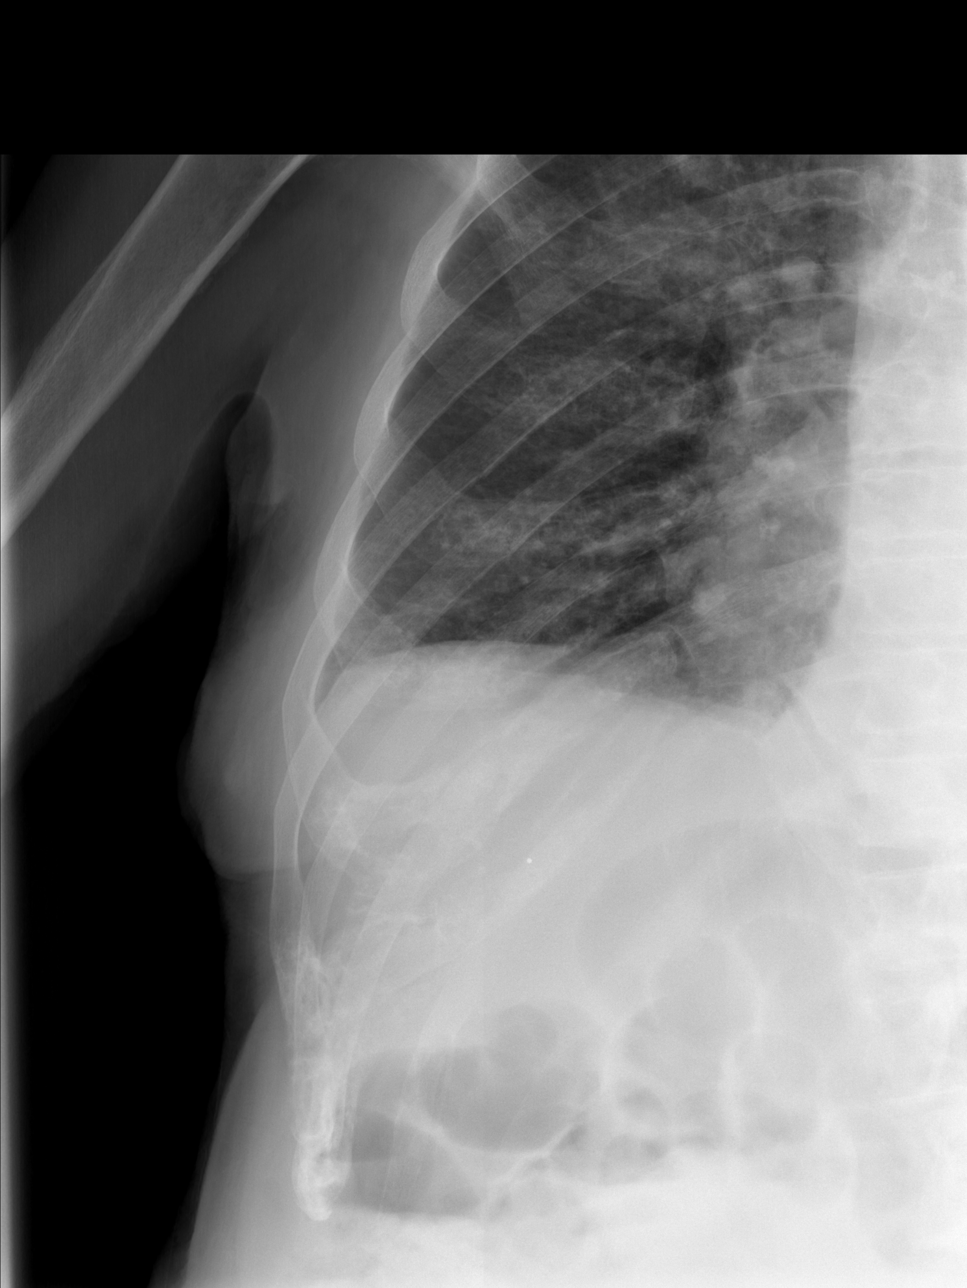

[w chest pa]
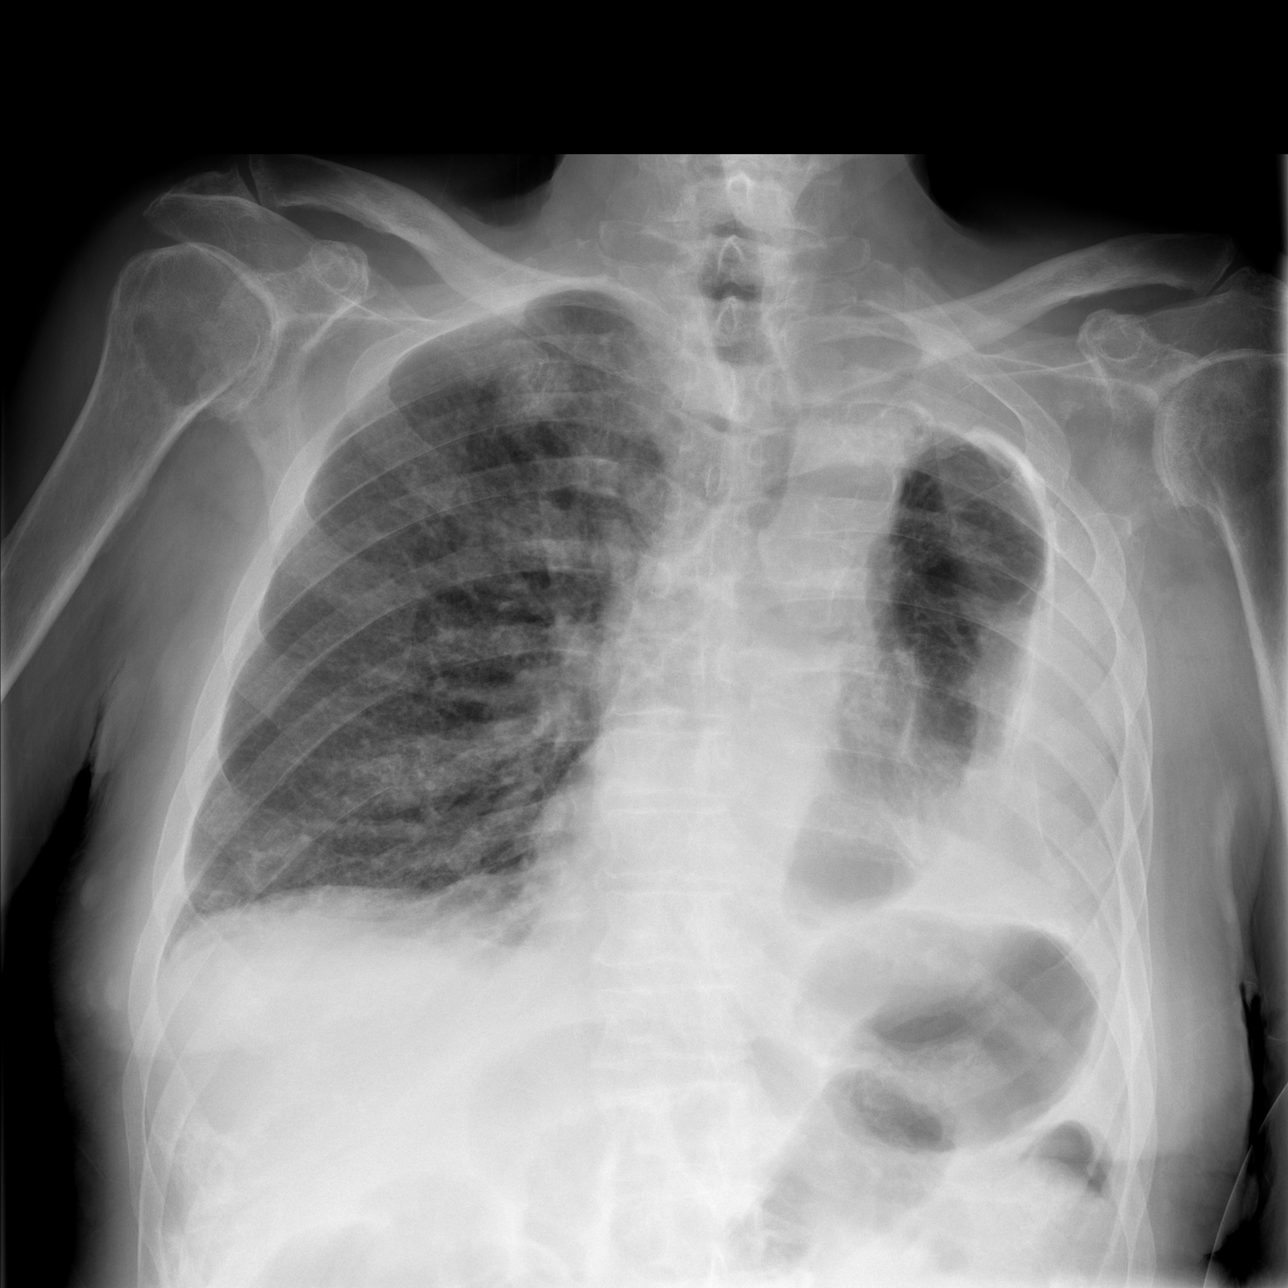

[3 of 3 positions shown; findings below may reference images not displayed]

FINDINGS: Mild shift of the heart and mediastinum to the left. Heart size is
grossly stable. Thoracic aorta is calcified. Chronic left
fibrothorax and volume loss in the left hemithorax, unchanged. Right
lung is clear.

No definite acute fracture. Scoliosis and degenerative changes in
the spine. Advanced degenerative changes are seen in both shoulders.
IMPRESSION: 1. No definite fracture.
2. Chronic pleuroparenchymal scarring in the left hemithorax.
3.  Aortic atherosclerosis (N[place]VOC-170.0).

## 2019-11-20 ENCOUNTER — Encounter: Payer: Self-pay | Admitting: Cardiology
